# Patient Record
Sex: Female | Born: 1945 | Race: White | Hispanic: No | Marital: Married | Smoking: Former smoker
Health system: Southern US, Community
[De-identification: ages and names within clinical notes are randomized; demographics above are authoritative.]

## PROBLEM LIST (undated history)

## (undated) DIAGNOSIS — D496 Neoplasm of unspecified behavior of brain: Secondary | ICD-10-CM

## (undated) DIAGNOSIS — E559 Vitamin D deficiency, unspecified: Secondary | ICD-10-CM

## (undated) DIAGNOSIS — M1990 Unspecified osteoarthritis, unspecified site: Secondary | ICD-10-CM

## (undated) DIAGNOSIS — E7800 Pure hypercholesterolemia, unspecified: Secondary | ICD-10-CM

## (undated) DIAGNOSIS — I10 Essential (primary) hypertension: Secondary | ICD-10-CM

## (undated) DIAGNOSIS — R569 Unspecified convulsions: Secondary | ICD-10-CM

## (undated) DIAGNOSIS — I720 Aneurysm of carotid artery: Secondary | ICD-10-CM

## (undated) DIAGNOSIS — C801 Malignant (primary) neoplasm, unspecified: Secondary | ICD-10-CM

## (undated) DIAGNOSIS — M8580 Other specified disorders of bone density and structure, unspecified site: Secondary | ICD-10-CM

## (undated) DIAGNOSIS — D72819 Decreased white blood cell count, unspecified: Secondary | ICD-10-CM

## (undated) DIAGNOSIS — N179 Acute kidney failure, unspecified: Secondary | ICD-10-CM

## (undated) DIAGNOSIS — G43909 Migraine, unspecified, not intractable, without status migrainosus: Secondary | ICD-10-CM

## (undated) DIAGNOSIS — H543 Unqualified visual loss, both eyes: Secondary | ICD-10-CM

## (undated) DIAGNOSIS — I7090 Unspecified atherosclerosis: Secondary | ICD-10-CM

## (undated) DIAGNOSIS — M719 Bursopathy, unspecified: Secondary | ICD-10-CM

---

## 2006-09-06 DIAGNOSIS — C801 Malignant (primary) neoplasm, unspecified: Secondary | ICD-10-CM

## 2006-09-06 HISTORY — DX: Malignant (primary) neoplasm, unspecified: C80.1

## 2007-05-08 HISTORY — PX: BRAIN SURGERY: SHX531

## 2013-09-21 NOTE — Progress Notes (Signed)
Surgery scheduled for 10/16/13.  preop on 09/27/13 at 1430pm.  Need orders in EPIC.  Thank You.

## 2013-09-24 ENCOUNTER — Encounter (HOSPITAL_COMMUNITY): Payer: Self-pay | Admitting: Pharmacy Technician

## 2013-09-25 ENCOUNTER — Other Ambulatory Visit: Payer: Self-pay | Admitting: Orthopedic Surgery

## 2013-09-26 ENCOUNTER — Other Ambulatory Visit (HOSPITAL_COMMUNITY): Payer: Self-pay | Admitting: Orthopedic Surgery

## 2013-09-26 NOTE — Patient Instructions (Addendum)
[facility]  09/26/2013   Your procedure is scheduled on: Tuesday February 10th  Report to [facility] at 1030 AM.  Call this number if you have problems the morning of surgery [phone]   Remember: short stay will draw your type and screen morning of surgery   Do not eat food :After Midnight.   clear liquids midnight until 700 am day of surgery, then nothing by mouth.   Take these medicines the morning of surgery with A SIP OF WATER: keppra, wellbutrin                                SEE [facility] PREPARING FOR SURGERY SHEET             You may not have any metal on your body including hair pins and piercings  Do not wear jewelry, make-up.  Do not wear lotions, powders, or perfumes. You may wear deodorant.   Men may shave face and neck.  Do not bring valuables to the hospital. [place].  Contacts, dentures or bridgework may not be worn into surgery.  Leave suitcase in the car. After surgery it may be brought to your room.  For patients admitted to the hospital, checkout time is 11:00 AM the day of discharge.    Please read over the following fact sheets that you were given: [facility] Preparing for surgery sheet, mrsa information, blood sheet , clear liquid sheet,incnetive spirometer sheet   Call Zelphia Cairo RN pre op nurse if needed [phone]    [place].  PATIENT SIGNATURE___________________________________________  NURSE SIGNATURE_____________________________________________

## 2013-09-27 ENCOUNTER — Ambulatory Visit (HOSPITAL_COMMUNITY)
Admission: RE | Admit: 2013-09-27 | Discharge: 2013-09-27 | Disposition: A | Discharge status: Home / Self Care | Payer: BC Managed Care – PPO | Source: Ambulatory Visit | Attending: Orthopedic Surgery | Admitting: Orthopedic Surgery

## 2013-09-27 ENCOUNTER — Encounter (HOSPITAL_COMMUNITY): Payer: Self-pay

## 2013-09-27 ENCOUNTER — Encounter (HOSPITAL_COMMUNITY)
Admission: RE | Admit: 2013-09-27 | Discharge: 2013-09-27 | Disposition: A | Discharge status: Home / Self Care | Payer: BC Managed Care – PPO | Source: Ambulatory Visit | Attending: Orthopedic Surgery | Admitting: Orthopedic Surgery

## 2013-09-27 DIAGNOSIS — M899 Disorder of bone, unspecified: Secondary | ICD-10-CM | POA: Insufficient documentation

## 2013-09-27 DIAGNOSIS — M949 Disorder of cartilage, unspecified: Secondary | ICD-10-CM

## 2013-09-27 DIAGNOSIS — M169 Osteoarthritis of hip, unspecified: Secondary | ICD-10-CM | POA: Insufficient documentation

## 2013-09-27 DIAGNOSIS — Z01812 Encounter for preprocedural laboratory examination: Secondary | ICD-10-CM | POA: Diagnosis not present

## 2013-09-27 DIAGNOSIS — Z01810 Encounter for preprocedural cardiovascular examination: Secondary | ICD-10-CM | POA: Diagnosis not present

## 2013-09-27 DIAGNOSIS — M1610 Unilateral primary osteoarthritis, unspecified hip: Secondary | ICD-10-CM | POA: Insufficient documentation

## 2013-09-27 DIAGNOSIS — Z01818 Encounter for other preprocedural examination: Secondary | ICD-10-CM | POA: Insufficient documentation

## 2013-09-27 DIAGNOSIS — I10 Essential (primary) hypertension: Secondary | ICD-10-CM | POA: Insufficient documentation

## 2013-09-27 DIAGNOSIS — Z79899 Other long term (current) drug therapy: Secondary | ICD-10-CM | POA: Insufficient documentation

## 2013-09-27 HISTORY — DX: Bursopathy, unspecified: M71.9

## 2013-09-27 HISTORY — DX: Aneurysm of carotid artery: I72.0

## 2013-09-27 HISTORY — DX: Unqualified visual loss, both eyes: H54.3

## 2013-09-27 HISTORY — DX: Unspecified osteoarthritis, unspecified site: M19.90

## 2013-09-27 HISTORY — DX: Pure hypercholesterolemia, unspecified: E78.00

## 2013-09-27 HISTORY — DX: Essential (primary) hypertension: I10

## 2013-09-27 HISTORY — DX: Decreased white blood cell count, unspecified: D72.819

## 2013-09-27 HISTORY — DX: Unspecified convulsions: R56.9

## 2013-09-27 HISTORY — DX: Neoplasm of unspecified behavior of brain: D49.6

## 2013-09-27 HISTORY — DX: Other specified disorders of bone density and structure, unspecified site: M85.80

## 2013-09-27 HISTORY — DX: Malignant (primary) neoplasm, unspecified: C80.1

## 2013-09-27 HISTORY — DX: Acute kidney failure, unspecified: N17.9

## 2013-09-27 HISTORY — DX: Vitamin D deficiency, unspecified: E55.9

## 2013-09-27 HISTORY — DX: Migraine, unspecified, not intractable, without status migrainosus: G43.909

## 2013-09-27 HISTORY — DX: Unspecified atherosclerosis: I70.90

## 2013-09-27 LAB — URINE MICROSCOPIC-ADD ON

## 2013-09-27 LAB — SURGICAL PCR SCREEN
MRSA, PCR: NEGATIVE
Staphylococcus aureus: NEGATIVE

## 2013-09-27 LAB — COMPREHENSIVE METABOLIC PANEL
ALT: 24 U/L (ref 0–35)
AST: 25 U/L (ref 0–37)
Albumin: 3.7 g/dL (ref 3.5–5.2)
Alkaline Phosphatase: 73 U/L (ref 39–117)
BUN: 29 mg/dL — AB (ref 6–23)
CO2: 28 meq/L (ref 19–32)
CREATININE: 1.3 mg/dL — AB (ref 0.50–1.10)
Calcium: 9.1 mg/dL (ref 8.4–10.5)
Chloride: 103 mEq/L (ref 96–112)
GFR calc Af Amer: 49 mL/min — ABNORMAL LOW (ref >90)
GFR, EST NON AFRICAN AMERICAN: 42 mL/min — AB (ref >90)
Glucose, Bld: 95 mg/dL (ref 70–99)
Potassium: 3.9 mEq/L (ref 3.7–5.3)
Sodium: 142 mEq/L (ref 137–147)
Total Bilirubin: 0.4 mg/dL (ref 0.3–1.2)
Total Protein: 6.8 g/dL (ref 6.0–8.3)

## 2013-09-27 LAB — URINALYSIS, ROUTINE W REFLEX MICROSCOPIC
Bilirubin Urine: NEGATIVE
Glucose, UA: NEGATIVE mg/dL
Hgb urine dipstick: NEGATIVE
KETONES UR: NEGATIVE mg/dL
NITRITE: NEGATIVE
PH: 5.5 (ref 5.0–8.0)
PROTEIN: NEGATIVE mg/dL
Specific Gravity, Urine: 1.026 (ref 1.005–1.030)
UROBILINOGEN UA: 0.2 mg/dL (ref 0.0–1.0)

## 2013-09-27 LAB — PROTIME-INR
INR: 0.96 (ref 0.00–1.49)
PROTHROMBIN TIME: 12.6 s (ref 11.6–15.2)

## 2013-09-27 LAB — CBC
HEMATOCRIT: 37.9 % (ref 36.0–46.0)
Hemoglobin: 12.7 g/dL (ref 12.0–15.0)
MCH: 31.2 pg (ref 26.0–34.0)
MCHC: 33.5 g/dL (ref 30.0–36.0)
MCV: 93.1 fL (ref 78.0–100.0)
PLATELETS: 217 10*3/uL (ref 150–400)
RBC: 4.07 MIL/uL (ref 3.87–5.11)
RDW: 13 % (ref 11.5–15.5)
WBC: 4.2 10*3/uL (ref 4.0–10.5)

## 2013-09-27 LAB — APTT: aPTT: 27 seconds (ref 24–37)

## 2013-09-27 NOTE — Progress Notes (Addendum)
lov medical clearance note dr Dellis Filbert binney 09-12-2013 on chart Brain mri report 07-16-2013 on chart Dental clearance note on chart from [facility] walker rdh

## 2013-09-28 NOTE — Progress Notes (Signed)
Urine, micro results faxed 09-27-13 to dr Wynelle Link, fax received 09-28-13 and placed on pt chart, no action micro ua per drew perkins pa

## 2013-10-15 ENCOUNTER — Other Ambulatory Visit: Payer: Self-pay | Admitting: Orthopedic Surgery

## 2013-10-15 NOTE — H&P (Signed)
Tracy Skinner  DOB: 26-Sep-1945 Married / Language: English / Race: White Female   Date of Admission: 10-16-2013  Chief Complaint:  Right Hip Pain  History of Present Illness The patient is a 68 year old female who come for a preoperative History and Physical. The patient is scheduled for a right total hip arthroplasty (anterior approach) to be performed by Dr. Dione Plover. Aluisio, MD at [facility] on 10-16-2013. The patient was seen in referral.  The patient reports left hip and right hip (is worse) problems including pain and giving way symptoms that have been present for year(s). The symptoms began without any known injury. Symptoms reported include hip pain, weakness, pain with weightbearing and difficulty ambulating The patient reports symptoms radiating to the: right groin and left groin. The patient describes the hip problem as shooting and burning.The symptoms are described as moderate in severity.The patient feels as if their symptoms are feels as if they are improving (due to physical therpay). Symptoms are exacerbated by lying on the affected side. Symptoms are relieved by rest. Previous workup for this problem has included hip x-rays. Previous treatment for this problem has included physical therapy. She said both hips hurt terribly. The right hip is worse than the left. She has pain at all times. She has difficulty even ambulating. She was doing some physical therapy which has stretched her a little bit and made it a little easier to walk but she is still utilizing a walker to get around because of the pain, stiffness and weakness from the arthritis. She is ready to proceed with hip replacement surgery. They have been treated conservatively in the past for the above stated problem and despite conservative measures, they continue to have progressive pain and severe functional limitations and dysfunction. They have failed non-operative management including home exercise,  medications, and injections. It is felt that they would benefit from undergoing total joint replacement. Risks and benefits of the procedure have been discussed with the patient and they elect to proceed with surgery. There are no active contraindications to surgery such as ongoing infection or rapidly progressive neurological disease.   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 wears glasses Brain tumor. High Grade Oligoastrocytoma Osteoarthritis Hypercholesterolemia   Family History Hypertension. Brother, Father. Cancer. Father.   Social History Marital status. married Never consumed alcohol. 08/09/2013: Never consumed alcohol Exercise. Exercises rarely Living situation. live with spouse No history of drug/alcohol rehab Not under pain contract Tobacco use. Former smoker. 08/09/2013 Children. 0 Current work status. retired Regulatory affairs officer. Healthcare [employer]   Medication History Keppra (500MG  Tablet, Oral) Active. BuPROPion HCl ( Oral) Specific dose unknown - Active. Losartan Potassium (25MG  Tablet, Oral) Active. Simvastatin (10MG  Tablet, Oral) Active.   Past Surgical History Brain Surgery. Date: 05/2007. Craniotomy   Review of Systems General:Not Present- Chills, Fever, Night Sweats, Fatigue, Weight Gain, Weight Loss and Memory Loss. Skin:Not Present- Hives, Itching, Rash, Eczema and Lesions. HEENT:Not Present- Tinnitus, Headache, Double Vision, Visual Loss, Hearing Loss and Dentures. Respiratory:Not Present- Shortness of breath with exertion, Shortness of breath at rest, Allergies, Coughing up blood and Chronic  Cough. Cardiovascular:Not Present- Chest Pain, Racing/skipping heartbeats, Difficulty Breathing Lying Down, Murmur, Swelling and Palpitations. Gastrointestinal:Not Present- Bloody Stool, Heartburn, Abdominal Pain, Vomiting, Nausea, Constipation, Diarrhea, Difficulty Swallowing, Jaundice  and Loss of appetitie. Female Genitourinary:Not Present- Blood in Urine, Urinary frequency, Weak urinary stream, Discharge, Flank Pain, Incontinence, Painful Urination, Urgency, Urinary Retention and Urinating at Night. Musculoskeletal:Present- Joint Pain. Not Present- Muscle Weakness, Muscle Pain, Joint Swelling, Back Pain, Morning Stiffness and Spasms. Neurological:Not Present- Tremor, Dizziness, Blackout spells, Paralysis, Difficulty with balance and Weakness. Psychiatric:Not Present- Insomnia.    Vitals Weight: 160 lb Height: 63 in Weight was reported by patient. Height was reported by patient. Body Surface Area: 1.8 m Body Mass Index: 28.34 kg/m Pulse: 92 (Regular) Resp.: 16 (Unlabored) BP: 128/82 (Sitting, Left Arm, Standard)   Physical Exam The physical exam findings are as follows:   General Mental Status - Alert, cooperative and good historian. General Appearance- pleasant. Not in acute distress. Orientation- Oriented X3. Build & Nutrition- Well nourished and Well developed.   Head and Neck Head- normocephalic, atraumatic . Neck Global Assessment- supple. no bruit auscultated on the right and no bruit auscultated on the left.   Eye Vision- Wears corrective lenses. Pupil- Bilateral- Regular and Round. Motion- Bilateral- EOMI.   Chest and Lung Exam Auscultation: Breath sounds:- clear at anterior chest wall and - clear at posterior chest wall. Adventitious sounds:- No Adventitious sounds.   Cardiovascular Auscultation:Rhythm- Regular rate and rhythm. Heart Sounds- S1 WNL and S2 WNL. Murmurs & Other Heart Sounds:Auscultation of the  heart reveals - No Murmurs.   Abdomen Palpation/Percussion:Tenderness- Abdomen is non-tender to palpation. Rigidity (guarding)- Abdomen is soft. Auscultation:Auscultation of the abdomen reveals - Bowel sounds normal.   Female Genitourinary Not done, not pertinent to present illness  Musculoskeletal On exam she is alert and oriented in no apparent distress. Her right hip shows flexion to 80, no internal or external rotation, only about 10 degrees of abduction. Left hip flexion about 95, about 10 internal rotation, 10 external rotation, 20 abduction with pain. Knee exam is normal. Pulses, sensation and motor are intact both lower extremities.  RADIOGRAPHS: AP pelvis, lateral both hips show severe endstage arthritis both hips, right somewhat worse than the left with large subchondral cystic formation and large degenerative cysts in the right femoral head. She has some erosion of the femoral head on the right.   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 Wynelle Link.  Plan is to go to SNF - [facility] in [place], [place]  PCP - Dr. Merryl Hacker [facility]  The patient will not receive TXA (tranexamic acid) due to: Arterial Disease  Signed electronically by Joelene Millin, III PA-C

## 2013-10-16 ENCOUNTER — Encounter (HOSPITAL_COMMUNITY)
Admission: RE | Disposition: A | Discharge status: Skilled Nursing Facility | Payer: Self-pay | Source: Ambulatory Visit | Attending: Orthopedic Surgery

## 2013-10-16 ENCOUNTER — Encounter (HOSPITAL_COMMUNITY): Payer: BC Managed Care – PPO | Admitting: Anesthesiology

## 2013-10-16 ENCOUNTER — Inpatient Hospital Stay (HOSPITAL_COMMUNITY): Payer: BC Managed Care – PPO

## 2013-10-16 ENCOUNTER — Inpatient Hospital Stay (HOSPITAL_COMMUNITY)
Admission: RE | Admit: 2013-10-16 | Discharge: 2013-10-19 | DRG: 470 | Disposition: A | Discharge status: Skilled Nursing Facility | Payer: BC Managed Care – PPO | Source: Ambulatory Visit | Attending: Orthopedic Surgery | Admitting: Orthopedic Surgery

## 2013-10-16 ENCOUNTER — Inpatient Hospital Stay (HOSPITAL_COMMUNITY): Payer: BC Managed Care – PPO | Admitting: Anesthesiology

## 2013-10-16 ENCOUNTER — Encounter (HOSPITAL_COMMUNITY): Payer: Self-pay | Admitting: *Deleted

## 2013-10-16 DIAGNOSIS — M1610 Unilateral primary osteoarthritis, unspecified hip: Principal | ICD-10-CM | POA: Diagnosis present

## 2013-10-16 DIAGNOSIS — I10 Essential (primary) hypertension: Secondary | ICD-10-CM | POA: Diagnosis present

## 2013-10-16 DIAGNOSIS — Z6828 Body mass index (BMI) 28.0-28.9, adult: Secondary | ICD-10-CM

## 2013-10-16 DIAGNOSIS — R5383 Other fatigue: Secondary | ICD-10-CM

## 2013-10-16 DIAGNOSIS — M899 Disorder of bone, unspecified: Secondary | ICD-10-CM | POA: Diagnosis present

## 2013-10-16 DIAGNOSIS — Z96649 Presence of unspecified artificial hip joint: Secondary | ICD-10-CM

## 2013-10-16 DIAGNOSIS — K219 Gastro-esophageal reflux disease without esophagitis: Secondary | ICD-10-CM | POA: Diagnosis present

## 2013-10-16 DIAGNOSIS — M949 Disorder of cartilage, unspecified: Secondary | ICD-10-CM

## 2013-10-16 DIAGNOSIS — R5381 Other malaise: Secondary | ICD-10-CM | POA: Diagnosis present

## 2013-10-16 DIAGNOSIS — M169 Osteoarthritis of hip, unspecified: Secondary | ICD-10-CM | POA: Diagnosis present

## 2013-10-16 DIAGNOSIS — Z87891 Personal history of nicotine dependence: Secondary | ICD-10-CM

## 2013-10-16 DIAGNOSIS — D62 Acute posthemorrhagic anemia: Secondary | ICD-10-CM | POA: Diagnosis not present

## 2013-10-16 DIAGNOSIS — E7800 Pure hypercholesterolemia, unspecified: Secondary | ICD-10-CM | POA: Diagnosis present

## 2013-10-16 DIAGNOSIS — I7090 Unspecified atherosclerosis: Secondary | ICD-10-CM | POA: Diagnosis present

## 2013-10-16 HISTORY — PX: TOTAL HIP ARTHROPLASTY: SHX124

## 2013-10-16 LAB — TYPE AND SCREEN
ABO/RH(D): A POS
ANTIBODY SCREEN: NEGATIVE

## 2013-10-16 LAB — ABO/RH: ABO/RH(D): A POS

## 2013-10-16 SURGERY — ARTHROPLASTY, HIP, TOTAL, ANTERIOR APPROACH
Anesthesia: General | Site: Hip | Laterality: Right

## 2013-10-16 MED ORDER — ONDANSETRON HCL 4 MG/2ML IJ SOLN
INTRAMUSCULAR | Status: AC
  Filled 2013-10-16: qty 2

## 2013-10-16 MED ORDER — NEOSTIGMINE METHYLSULFATE 1 MG/ML IJ SOLN
INTRAMUSCULAR | Status: DC | PRN
  Administered 2013-10-16: 4 mg via INTRAVENOUS

## 2013-10-16 MED ORDER — LEVETIRACETAM ER 500 MG PO TB24
500.0000 mg | ORAL_TABLET | Freq: Three times a day (TID) | ORAL | Status: DC
  Administered 2013-10-16 – 2013-10-19 (×8): 500 mg via ORAL

## 2013-10-16 MED ORDER — ACETAMINOPHEN 500 MG PO TABS
1000.0000 mg | ORAL_TABLET | Freq: Four times a day (QID) | ORAL | Status: AC
  Administered 2013-10-16: 1000 mg via ORAL
  Filled 2013-10-16 (×3): qty 2

## 2013-10-16 MED ORDER — DOCUSATE SODIUM 100 MG PO CAPS
100.0000 mg | ORAL_CAPSULE | Freq: Two times a day (BID) | ORAL | Status: DC
  Administered 2013-10-16 – 2013-10-19 (×6): 100 mg via ORAL

## 2013-10-16 MED ORDER — SODIUM CHLORIDE 0.9 % IJ SOLN
INTRAMUSCULAR | Status: AC
  Filled 2013-10-16: qty 50

## 2013-10-16 MED ORDER — BUPROPION HCL ER (XL) 300 MG PO TB24
450.0000 mg | ORAL_TABLET | Freq: Every morning | ORAL | Status: DC
  Administered 2013-10-17 – 2013-10-19 (×3): 450 mg via ORAL
  Filled 2013-10-16 (×3): qty 1

## 2013-10-16 MED ORDER — FLEET ENEMA 7-19 GM/118ML RE ENEM: 1.0000 | ENEMA | Freq: Once | RECTAL | Status: AC | PRN

## 2013-10-16 MED ORDER — OXYCODONE HCL 5 MG PO TABS
5.0000 mg | ORAL_TABLET | ORAL | Status: DC | PRN
  Administered 2013-10-16 – 2013-10-19 (×3): 5 mg via ORAL
  Filled 2013-10-16 (×5): qty 1

## 2013-10-16 MED ORDER — KETOROLAC TROMETHAMINE 15 MG/ML IJ SOLN
7.5000 mg | Freq: Four times a day (QID) | INTRAMUSCULAR | Status: AC | PRN
  Administered 2013-10-16: 7.5 mg via INTRAVENOUS

## 2013-10-16 MED ORDER — 0.9 % SODIUM CHLORIDE (POUR BTL) OPTIME
TOPICAL | Status: DC | PRN
  Administered 2013-10-16: 1000 mL

## 2013-10-16 MED ORDER — CHLORHEXIDINE GLUCONATE CLOTH 2 % EX PADS: 6.0000 | MEDICATED_PAD | Freq: Once | CUTANEOUS | Status: DC

## 2013-10-16 MED ORDER — BISACODYL 10 MG RE SUPP: 10.0000 mg | Freq: Every day | RECTAL | Status: DC | PRN

## 2013-10-16 MED ORDER — BUPIVACAINE LIPOSOME 1.3 % IJ SUSP
20.0000 mL | Freq: Once | INTRAMUSCULAR | Status: DC
  Filled 2013-10-16: qty 20

## 2013-10-16 MED ORDER — GLYCOPYRROLATE 0.2 MG/ML IJ SOLN
INTRAMUSCULAR | Status: DC | PRN
  Administered 2013-10-16: .6 mg via INTRAVENOUS

## 2013-10-16 MED ORDER — ACETAMINOPHEN 500 MG PO TABS
1000.0000 mg | ORAL_TABLET | Freq: Once | ORAL | Status: AC
  Administered 2013-10-16: 1000 mg via ORAL
  Filled 2013-10-16: qty 2

## 2013-10-16 MED ORDER — MIDAZOLAM HCL 2 MG/2ML IJ SOLN
INTRAMUSCULAR | Status: AC
  Filled 2013-10-16: qty 2

## 2013-10-16 MED ORDER — DEXAMETHASONE SODIUM PHOSPHATE 10 MG/ML IJ SOLN
INTRAMUSCULAR | Status: AC
  Filled 2013-10-16: qty 1

## 2013-10-16 MED ORDER — DEXAMETHASONE SODIUM PHOSPHATE 10 MG/ML IJ SOLN
10.0000 mg | Freq: Every day | INTRAMUSCULAR | Status: AC
  Filled 2013-10-16: qty 1

## 2013-10-16 MED ORDER — HYDROMORPHONE HCL PF 1 MG/ML IJ SOLN
INTRAMUSCULAR | Status: DC | PRN
  Administered 2013-10-16 (×3): 0.5 mg via INTRAVENOUS

## 2013-10-16 MED ORDER — LIDOCAINE HCL (CARDIAC) 20 MG/ML IV SOLN
INTRAVENOUS | Status: DC | PRN
  Administered 2013-10-16: 80 mg via INTRAVENOUS

## 2013-10-16 MED ORDER — HYDROMORPHONE HCL PF 2 MG/ML IJ SOLN
INTRAMUSCULAR | Status: AC
  Filled 2013-10-16: qty 1

## 2013-10-16 MED ORDER — ONDANSETRON HCL 4 MG/2ML IJ SOLN
INTRAMUSCULAR | Status: DC | PRN
  Administered 2013-10-16: 4 mg via INTRAVENOUS

## 2013-10-16 MED ORDER — PROPOFOL 10 MG/ML IV BOLUS
INTRAVENOUS | Status: AC
  Filled 2013-10-16: qty 20

## 2013-10-16 MED ORDER — ONDANSETRON HCL 4 MG PO TABS: 4.0000 mg | ORAL_TABLET | Freq: Four times a day (QID) | ORAL | Status: DC | PRN

## 2013-10-16 MED ORDER — CEFAZOLIN SODIUM-DEXTROSE 2-3 GM-% IV SOLR
INTRAVENOUS | Status: AC
  Filled 2013-10-16: qty 50

## 2013-10-16 MED ORDER — PHENOL 1.4 % MT LIQD: 1.0000 | OROMUCOSAL | Status: DC | PRN

## 2013-10-16 MED ORDER — PROPOFOL 10 MG/ML IV BOLUS
INTRAVENOUS | Status: DC | PRN
  Administered 2013-10-16: 150 mg via INTRAVENOUS

## 2013-10-16 MED ORDER — SIMVASTATIN 10 MG PO TABS
10.0000 mg | ORAL_TABLET | Freq: Every evening | ORAL | Status: DC
  Administered 2013-10-16 – 2013-10-18 (×3): 10 mg via ORAL
  Filled 2013-10-16 (×4): qty 1

## 2013-10-16 MED ORDER — SODIUM CHLORIDE 0.9 % IV SOLN: INTRAVENOUS | Status: DC

## 2013-10-16 MED ORDER — DIPHENHYDRAMINE HCL 12.5 MG/5ML PO ELIX: 12.5000 mg | ORAL_SOLUTION | ORAL | Status: DC | PRN

## 2013-10-16 MED ORDER — CEFAZOLIN SODIUM-DEXTROSE 2-3 GM-% IV SOLR
2.0000 g | Freq: Four times a day (QID) | INTRAVENOUS | Status: AC
  Administered 2013-10-16 – 2013-10-17 (×2): 2 g via INTRAVENOUS
  Filled 2013-10-16 (×2): qty 50

## 2013-10-16 MED ORDER — LIDOCAINE HCL (CARDIAC) 20 MG/ML IV SOLN
INTRAVENOUS | Status: AC
  Filled 2013-10-16: qty 5

## 2013-10-16 MED ORDER — KETOROLAC TROMETHAMINE 15 MG/ML IJ SOLN
INTRAMUSCULAR | Status: AC
  Filled 2013-10-16: qty 1

## 2013-10-16 MED ORDER — FENTANYL CITRATE 0.05 MG/ML IJ SOLN
INTRAMUSCULAR | Status: DC | PRN
  Administered 2013-10-16 (×2): 50 ug via INTRAVENOUS
  Administered 2013-10-16: 100 ug via INTRAVENOUS
  Administered 2013-10-16: 50 ug via INTRAVENOUS

## 2013-10-16 MED ORDER — MENTHOL 3 MG MT LOZG
1.0000 | LOZENGE | OROMUCOSAL | Status: DC | PRN
  Filled 2013-10-16: qty 9

## 2013-10-16 MED ORDER — ONDANSETRON HCL 4 MG/2ML IJ SOLN
4.0000 mg | Freq: Four times a day (QID) | INTRAMUSCULAR | Status: DC | PRN
Start: 2013-10-16 — End: 2013-10-19

## 2013-10-16 MED ORDER — LOSARTAN POTASSIUM 50 MG PO TABS
50.0000 mg | ORAL_TABLET | Freq: Every day | ORAL | Status: DC
  Administered 2013-10-16 – 2013-10-18 (×3): 50 mg via ORAL
  Filled 2013-10-16 (×4): qty 1

## 2013-10-16 MED ORDER — RIVAROXABAN 10 MG PO TABS
10.0000 mg | ORAL_TABLET | Freq: Every day | ORAL | Status: DC
  Administered 2013-10-17 – 2013-10-19 (×3): 10 mg via ORAL
  Filled 2013-10-16 (×4): qty 1

## 2013-10-16 MED ORDER — METOCLOPRAMIDE HCL 5 MG/ML IJ SOLN
5.0000 mg | Freq: Three times a day (TID) | INTRAMUSCULAR | Status: DC | PRN
  Administered 2013-10-16: 5 mg via INTRAVENOUS

## 2013-10-16 MED ORDER — LACTATED RINGERS IV SOLN: INTRAVENOUS | Status: DC

## 2013-10-16 MED ORDER — METOCLOPRAMIDE HCL 10 MG PO TABS: 5.0000 mg | ORAL_TABLET | Freq: Three times a day (TID) | ORAL | Status: DC | PRN

## 2013-10-16 MED ORDER — KCL IN DEXTROSE-NACL 20-5-0.45 MEQ/L-%-% IV SOLN
INTRAVENOUS | Status: DC
  Administered 2013-10-16 – 2013-10-17 (×2): 75 mL/h via INTRAVENOUS
  Filled 2013-10-16 (×3): qty 1000

## 2013-10-16 MED ORDER — METOCLOPRAMIDE HCL 5 MG/ML IJ SOLN
INTRAMUSCULAR | Status: AC
  Filled 2013-10-16: qty 2

## 2013-10-16 MED ORDER — FENTANYL CITRATE 0.05 MG/ML IJ SOLN
INTRAMUSCULAR | Status: AC
  Filled 2013-10-16: qty 5

## 2013-10-16 MED ORDER — GLYCOPYRROLATE 0.2 MG/ML IJ SOLN
INTRAMUSCULAR | Status: AC
  Filled 2013-10-16: qty 3

## 2013-10-16 MED ORDER — ACETAMINOPHEN 650 MG RE SUPP: 650.0000 mg | Freq: Four times a day (QID) | RECTAL | Status: DC | PRN

## 2013-10-16 MED ORDER — DEXAMETHASONE SODIUM PHOSPHATE 10 MG/ML IJ SOLN
10.0000 mg | Freq: Once | INTRAMUSCULAR | Status: AC
  Administered 2013-10-16: 10 mg via INTRAVENOUS

## 2013-10-16 MED ORDER — TRAMADOL HCL 50 MG PO TABS: 50.0000 mg | ORAL_TABLET | Freq: Four times a day (QID) | ORAL | Status: DC | PRN

## 2013-10-16 MED ORDER — SODIUM CHLORIDE 0.9 % IJ SOLN
INTRAMUSCULAR | Status: DC | PRN
  Administered 2013-10-16: 15:00:00

## 2013-10-16 MED ORDER — BUPIVACAINE HCL (PF) 0.25 % IJ SOLN
INTRAMUSCULAR | Status: DC | PRN
  Administered 2013-10-16: 30 mL

## 2013-10-16 MED ORDER — POLYETHYLENE GLYCOL 3350 17 G PO PACK: 17.0000 g | PACK | Freq: Every day | ORAL | Status: DC | PRN

## 2013-10-16 MED ORDER — CEFAZOLIN SODIUM-DEXTROSE 2-3 GM-% IV SOLR
2.0000 g | INTRAVENOUS | Status: AC
  Administered 2013-10-16: 2 g via INTRAVENOUS

## 2013-10-16 MED ORDER — HYDROMORPHONE HCL PF 1 MG/ML IJ SOLN: 0.2500 mg | INTRAMUSCULAR | Status: DC | PRN

## 2013-10-16 MED ORDER — SUCCINYLCHOLINE CHLORIDE 20 MG/ML IJ SOLN
INTRAMUSCULAR | Status: DC | PRN
  Administered 2013-10-16: 100 mg via INTRAVENOUS

## 2013-10-16 MED ORDER — METHOCARBAMOL 100 MG/ML IJ SOLN
500.0000 mg | Freq: Four times a day (QID) | INTRAVENOUS | Status: DC | PRN
  Administered 2013-10-16: 500 mg via INTRAVENOUS
  Filled 2013-10-16: qty 5

## 2013-10-16 MED ORDER — DEXAMETHASONE 4 MG PO TABS
10.0000 mg | ORAL_TABLET | Freq: Every day | ORAL | Status: AC
  Administered 2013-10-17: 10 mg via ORAL
  Filled 2013-10-16: qty 1

## 2013-10-16 MED ORDER — ACETAMINOPHEN 325 MG PO TABS
650.0000 mg | ORAL_TABLET | Freq: Four times a day (QID) | ORAL | Status: DC | PRN
  Administered 2013-10-18 – 2013-10-19 (×4): 650 mg via ORAL
  Filled 2013-10-16 (×4): qty 2

## 2013-10-16 MED ORDER — METHOCARBAMOL 500 MG PO TABS
500.0000 mg | ORAL_TABLET | Freq: Four times a day (QID) | ORAL | Status: DC | PRN
  Administered 2013-10-17 – 2013-10-19 (×2): 500 mg via ORAL
  Filled 2013-10-16 (×2): qty 1

## 2013-10-16 MED ORDER — LACTATED RINGERS IV SOLN
INTRAVENOUS | Status: DC | PRN
  Administered 2013-10-16: 12:00:00 via INTRAVENOUS

## 2013-10-16 MED ORDER — BUPIVACAINE HCL (PF) 0.25 % IJ SOLN
INTRAMUSCULAR | Status: AC
  Filled 2013-10-16: qty 30

## 2013-10-16 MED ORDER — ROCURONIUM BROMIDE 100 MG/10ML IV SOLN
INTRAVENOUS | Status: DC | PRN
  Administered 2013-10-16: 30 mg via INTRAVENOUS

## 2013-10-16 MED ORDER — MIDAZOLAM HCL 5 MG/5ML IJ SOLN
INTRAMUSCULAR | Status: DC | PRN
  Administered 2013-10-16: 2 mg via INTRAVENOUS

## 2013-10-16 SURGICAL SUPPLY — 41 items
BAG ZIPLOCK 12X15 (MISCELLANEOUS) IMPLANT
BLADE SAW SGTL 18X1.27X75 (BLADE) ×2 IMPLANT
BLADE SAW SGTL 18X1.27X75MM (BLADE) ×1
CAPT HIP PF COP ×3 IMPLANT
CLOSURE WOUND 1/2 X4 (GAUZE/BANDAGES/DRESSINGS) ×1
DECANTER SPIKE VIAL GLASS SM (MISCELLANEOUS) ×3 IMPLANT
DRAPE C-ARM 42X120 X-RAY (DRAPES) ×3 IMPLANT
DRAPE STERI IOBAN 125X83 (DRAPES) ×3 IMPLANT
DRAPE U-SHAPE 47X51 STRL (DRAPES) ×9 IMPLANT
DRSG ADAPTIC 3X8 NADH LF (GAUZE/BANDAGES/DRESSINGS) ×3 IMPLANT
DRSG MEPILEX BORDER 4X4 (GAUZE/BANDAGES/DRESSINGS) ×3 IMPLANT
DRSG MEPILEX BORDER 4X8 (GAUZE/BANDAGES/DRESSINGS) ×3 IMPLANT
DURAPREP 26ML APPLICATOR (WOUND CARE) ×3 IMPLANT
ELECT BLADE 6.5 EXT (BLADE) ×3 IMPLANT
ELECT REM PT RETURN 9FT ADLT (ELECTROSURGICAL) ×3
ELECTRODE REM PT RTRN 9FT ADLT (ELECTROSURGICAL) ×1 IMPLANT
EVACUATOR 1/8 PVC DRAIN (DRAIN) ×3 IMPLANT
FACESHIELD LNG OPTICON STERILE (SAFETY) ×12 IMPLANT
GLOVE BIO SURGEON STRL SZ7.5 (GLOVE) ×3 IMPLANT
GLOVE BIO SURGEON STRL SZ8 (GLOVE) ×6 IMPLANT
GLOVE BIOGEL PI IND STRL 8 (GLOVE) ×2 IMPLANT
GLOVE BIOGEL PI INDICATOR 8 (GLOVE) ×4
GOWN STRL REUS W/TWL LRG LVL3 (GOWN DISPOSABLE) ×3 IMPLANT
GOWN STRL REUS W/TWL XL LVL3 (GOWN DISPOSABLE) ×3 IMPLANT
KIT BASIN OR (CUSTOM PROCEDURE TRAY) ×3 IMPLANT
NDL SAFETY ECLIPSE 18X1.5 (NEEDLE) ×2 IMPLANT
NEEDLE HYPO 18GX1.5 SHARP (NEEDLE) ×4
PACK TOTAL JOINT (CUSTOM PROCEDURE TRAY) ×3 IMPLANT
PADDING CAST COTTON 6X4 STRL (CAST SUPPLIES) ×3 IMPLANT
SPONGE GAUZE 4X4 12PLY (GAUZE/BANDAGES/DRESSINGS) IMPLANT
STRIP CLOSURE SKIN 1/2X4 (GAUZE/BANDAGES/DRESSINGS) ×2 IMPLANT
SUCTION FRAZIER 12FR DISP (SUCTIONS) IMPLANT
SUT ETHIBOND NAB CT1 #1 30IN (SUTURE) ×3 IMPLANT
SUT MNCRL AB 4-0 PS2 18 (SUTURE) ×3 IMPLANT
SUT VIC AB 2-0 CT1 27 (SUTURE) ×4
SUT VIC AB 2-0 CT1 TAPERPNT 27 (SUTURE) ×2 IMPLANT
SUT VLOC 180 0 24IN GS25 (SUTURE) ×3 IMPLANT
SYR 20CC LL (SYRINGE) ×3 IMPLANT
SYR 50ML LL SCALE MARK (SYRINGE) ×3 IMPLANT
TOWEL OR 17X26 10 PK STRL BLUE (TOWEL DISPOSABLE) ×3 IMPLANT
TRAY FOLEY CATH 14FRSI W/METER (CATHETERS) ×3 IMPLANT

## 2013-10-16 NOTE — Progress Notes (Signed)
Utilization review completed.  

## 2013-10-16 NOTE — Op Note (Signed)
OPERATIVE REPORT  PREOPERATIVE DIAGNOSIS: Osteoarthritis of the Right hip.   POSTOPERATIVE DIAGNOSIS: Osteoarthritis of the Right  hip.   PROCEDURE: Right total hip arthroplasty, anterior approach.   SURGEON: Gaynelle Arabian, MD   ASSISTANT: Arlee Muslim, PA-C  ANESTHESIA:  General  ESTIMATED BLOOD LOSS:- 500 ml  DRAINS: Hemovac x1.   COMPLICATIONS: None   CONDITION: PACU - hemodynamically stable.   BRIEF CLINICAL NOTE: Tracy Skinner is a 68 y.o. female who has advanced end-  stage arthritis of his Right  hip with progressively worsening pain and  dysfunction.The patient has failed nonoperative management and presents for  total hip arthroplasty.   PROCEDURE IN DETAIL: After successful administration of spinal  anesthetic, the traction boots for the [facility] bed were placed on both  feet and the patient was placed onto the [facility] bed, boots placed into the leg  holders. The Right hip was then isolated from the perineum with plastic  drapes and prepped and draped in the usual sterile fashion. ASIS and  greater trochanter were marked and a oblique incision was made, starting  at about 1 cm lateral and 2 cm distal to the ASIS and coursing towards  the anterior cortex of the femur. The skin was cut with a 10 blade  through subcutaneous tissue to the level of the fascia overlying the  tensor fascia lata muscle. The fascia was then incised in line with the  incision at the junction of the anterior third and posterior 2/3rd. The  muscle was teased off the fascia and then the interval between the TFL  and the rectus was developed. The Hohmann retractor was then placed at  the top of the femoral neck over the capsule. The vessels overlying the  capsule were cauterized and the fat on top of the capsule was removed.  A Hohmann retractor was then placed anterior underneath the rectus  femoris to give exposure to the entire anterior capsule. A T-shaped  capsulotomy was performed. The  edges were tagged and the femoral head  was identified.       Osteophytes are removed off the superior acetabulum.  The femoral neck was then cut in situ with an oscillating saw. Traction  was then applied to the left lower extremity utilizing the [facility]  traction. The femoral head was then removed. Retractors were placed  around the acetabulum and then circumferential removal of the labrum was  performed. Osteophytes were also removed. Reaming starts at 45 mm to  medialize and  Increased in 2 mm increments to 47 mm. We reamed in  approximately 40 degrees of abduction, 20 degrees anteversion. A 48 mm  pinnacle acetabular shell was then impacted in anatomic position under  fluoroscopic guidance with excellent purchase. We did not need to place  any additional dome screws. A 28 mm neutral + 4 marathon liner was then  placed into the acetabular shell.       The femoral lift was then placed along the lateral aspect of the femur  just distal to the vastus ridge. The leg was  externally rotated and capsule  was stripped off the inferior aspect of the femoral neck down to the  level of the lesser trochanter, this was done with electrocautery. The femur was lifted after this was performed. The  leg was then placed and extended in adducted position to essentially delivering the femur. We also removed the capsule superiorly and the  piriformis from the piriformis fossa  to gain excellent exposure of the  proximal femur. Rongeur was used to remove some cancellous bone to get  into the lateral portion of the proximal femur for placement of the  initial starter reamer. The starter broaches was placed  the starter broach  and was shown to go down the center of the canal. Broaching  with the  [place] system was then performed starting at size 8, coursing  Up to size 11. A size 11 had excellent torsional and rotational  and axial stability. The trial high offset neck was then placed  with a 28 + 1.5 trial  head. The hip was then reduced. We confirmed that  the stem was in the canal both on AP and lateral x-rays. It also has excellent sizing. The hip was reduced with outstanding stability through full extension, full external rotation,  and then flexion in adduction internal rotation. AP pelvis was taken  and the leg lengths were measured and found to be exactly equal. Hip  was then dislocated again and the femoral head and neck removed. The  femoral broach was removed. Size 11 [place] stem with a high offset  neck was then impacted into the femur following native anteversion. Has  excellent purchase in the canal. Excellent torsional and rotational and  axial stability. It is confirmed to be in the canal on AP and lateral  fluoroscopic views. The 28 + 1.5 ceramic head was placed and the hip  reduced with outstanding stability. Again AP pelvis was taken and it  confirmed that the leg lengths were equal. The wound was then copiously  irrigated with saline solution and the capsule reattached and repaired  with Ethibond suture.  20 mL of Exparel mixed with 50 mL of saline then additional 20 ml of .25% Bupivicaine injected into the capsule and into the edge of the tensor fascia lata as well as subcutaneous tissue. The fascia overlying the tensor fascia lata was  then closed with a running #1 V-Loc. Subcu was closed with interrupted  2-0 Vicryl and subcuticular running 4-0 Monocryl. Incision was cleaned  and dried. Steri-Strips and a bulky sterile dressing applied. Hemovac  drain was hooked to suction and then he was awakened and transported to  recovery in stable condition.        Please note that a surgical assistant was a medical necessity for this procedure to perform it in a safe and expeditious manner. Assistant was necessary to provide appropriate retraction of vital neurovascular structures and to prevent femoral fracture and allow for anatomic placement of the prosthesis.  Gaynelle Arabian, M.D.

## 2013-10-16 NOTE — Anesthesia Postprocedure Evaluation (Signed)
  Anesthesia Post-op Note  Patient: Tracy Skinner  Procedure(s) Performed: Procedure(s) (LRB): RIGHT TOTAL HIP ARTHROPLASTY ANTERIOR APPROACH (Right)  Patient Location: PACU  Anesthesia Type: General  Level of Consciousness: awake and alert   Airway and Oxygen Therapy: Patient Spontanous Breathing  Post-op Pain: mild  Post-op Assessment: Post-op Vital signs reviewed, Patient's Cardiovascular Status Stable, Respiratory Function Stable, Patent Airway and No signs of Nausea or vomiting  Last Vitals:  Filed Vitals:   10/16/13 1600  BP: 118/81  Pulse: 74  Temp:   Resp: 11    Post-op Vital Signs: stable   Complications: No apparent anesthesia complications

## 2013-10-16 NOTE — Transfer of Care (Signed)
Immediate Anesthesia Transfer of Care Note  Patient: Tracy Skinner  Procedure(s) Performed: Procedure(s): RIGHT TOTAL HIP ARTHROPLASTY ANTERIOR APPROACH (Right)  Patient Location: PACU  Anesthesia Type:General  Level of Consciousness: awake, alert , oriented and patient cooperative  Airway & Oxygen Therapy: Patient Spontanous Breathing and Patient connected to face mask oxygen  Post-op Assessment: Report given to PACU RN, Post -op Vital signs reviewed and stable and Patient moving all extremities  Post vital signs: Reviewed and stable  Complications: No apparent anesthesia complications

## 2013-10-16 NOTE — Anesthesia Preprocedure Evaluation (Addendum)
Anesthesia Evaluation  Patient identified by MRN, date of birth, ID band Patient awake    Reviewed: Allergy & Precautions, H&P , NPO status , Patient's Chart, lab work & pertinent test results  Airway Mallampati: II TM Distance: >3 FB Neck ROM: full    Dental no notable dental hx. (+) Teeth Intact and Dental Advisory Given   Pulmonary neg pulmonary ROS, former smoker,  breath sounds clear to auscultation  Pulmonary exam normal       Cardiovascular Exercise Tolerance: Good hypertension, Pt. on medications Rhythm:regular Rate:Normal     Neuro/Psych  Headaches, Seizures -, Well Controlled,  High grade oligogastrocytoma. Last seizure 4 years ago. Carotid artery aneurysm negative psych ROS   GI/Hepatic negative GI ROS, Neg liver ROS,   Endo/Other  negative endocrine ROS  Renal/GU negative Renal ROSHistory ARF due to dehydration  negative genitourinary   Musculoskeletal   Abdominal   Peds  Hematology negative hematology ROS (+)   Anesthesia Other Findings   Reproductive/Obstetrics negative OB ROS                        Anesthesia Physical Anesthesia Plan  ASA: III  Anesthesia Plan: General   Post-op Pain Management:    Induction: Intravenous  Airway Management Planned: Oral ETT  Additional Equipment:   Intra-op Plan:   Post-operative Plan: Extubation in OR  Informed Consent: I have reviewed the patients History and Physical, chart, labs and discussed the procedure including the risks, benefits and alternatives for the proposed anesthesia with the patient or authorized representative who has indicated his/her understanding and acceptance.   Dental Advisory Given  Plan Discussed with: CRNA and Surgeon  Anesthesia Plan Comments:        Anesthesia Quick Evaluation

## 2013-10-16 NOTE — Preoperative (Signed)
Beta Blockers   Reason not to administer Beta Blockers:Not Applicable 

## 2013-10-16 NOTE — Interval H&P Note (Signed)
History and Physical Interval Note:  10/16/2013 1:22 PM  Tracy Skinner  has presented today for surgery, with the diagnosis of osteoarthritis of the Right Hip  The various methods of treatment have been discussed with the patient and family. After consideration of risks, benefits and other options for treatment, the patient has consented to  Procedure(s): RIGHT TOTAL HIP ARTHROPLASTY ANTERIOR APPROACH (Right) as a surgical intervention .  The patient's history has been reviewed, patient examined, no change in status, stable for surgery.  I have reviewed the patient's chart and labs.  Questions were answered to the patient's satisfaction.     [name]

## 2013-10-16 NOTE — H&P (View-Only) (Signed)
Tracy Skinner  DOB: 03/07/1946 Married / Language: English / Race: White Female   Date of Admission: 10-16-2013  Chief Complaint:  Right Hip Pain  History of Present Illness The patient is a 68 year old female who come for a preoperative History and Physical. The patient is scheduled for a right total hip arthroplasty (anterior approach) to be performed by Dr. Frank V. Aluisio, MD at [facility] on 10-16-2013. The patient was seen in referral.  The patient reports left hip and right hip (is worse) problems including pain and giving way symptoms that have been present for year(s). The symptoms began without any known injury. Symptoms reported include hip pain, weakness, pain with weightbearing and difficulty ambulating The patient reports symptoms radiating to the: right groin and left groin. The patient describes the hip problem as shooting and burning.The symptoms are described as moderate in severity.The patient feels as if their symptoms are feels as if they are improving (due to physical therpay). Symptoms are exacerbated by lying on the affected side. Symptoms are relieved by rest. Previous workup for this problem has included hip x-rays. Previous treatment for this problem has included physical therapy. She said both hips hurt terribly. The right hip is worse than the left. She has pain at all times. She has difficulty even ambulating. She was doing some physical therapy which has stretched her a little bit and made it a little easier to walk but she is still utilizing a walker to get around because of the pain, stiffness and weakness from the arthritis. She is ready to proceed with hip replacement surgery. They have been treated conservatively in the past for the above stated problem and despite conservative measures, they continue to have progressive pain and severe functional limitations and dysfunction. They have failed non-operative management including home exercise,  medications, and injections. It is felt that they would benefit from undergoing total joint replacement. Risks and benefits of the procedure have been discussed with the patient and they elect to proceed with surgery. There are no active contraindications to surgery such as ongoing infection or rapidly progressive neurological disease.   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 wears glasses Brain tumor. High Grade Oligoastrocytoma Osteoarthritis Hypercholesterolemia   Family History Hypertension. Brother, Father. Cancer. Father.   Social History Marital status. married Never consumed alcohol. 08/09/2013: Never consumed alcohol Exercise. Exercises rarely Living situation. live with spouse No history of drug/alcohol rehab Not under pain contract Tobacco use. Former smoker. 08/09/2013 Children. 0 Current work status. retired [employer] Directives. Healthcare POA   Medication History Keppra (500MG Tablet, Oral) Active. BuPROPion HCl ( Oral) Specific dose unknown - Active. Losartan Potassium (25MG Tablet, Oral) Active. Simvastatin (10MG Tablet, Oral) Active.   Past Surgical History Brain Surgery. Date: 05/2007. Craniotomy   Review of Systems General:Not Present- Chills, Fever, Night Sweats, Fatigue, Weight Gain, Weight Loss and Memory Loss. Skin:Not Present- Hives, Itching, Rash, Eczema and Lesions. HEENT:Not Present- Tinnitus, Headache, Double Vision, Visual Loss, Hearing Loss and Dentures. Respiratory:Not Present- Shortness of breath with exertion, Shortness of breath at rest, Allergies, Coughing up blood and Chronic  Cough. Cardiovascular:Not Present- Chest Pain, Racing/skipping heartbeats, Difficulty Breathing Lying Down, Murmur, Swelling and Palpitations. Gastrointestinal:Not Present- Bloody Stool, Heartburn, Abdominal Pain, Vomiting, Nausea, Constipation, Diarrhea, Difficulty Swallowing, Jaundice   and Loss of appetitie. Female Genitourinary:Not Present- Blood in Urine, Urinary frequency, Weak urinary stream, Discharge, Flank Pain, Incontinence, Painful Urination, Urgency, Urinary Retention and Urinating at Night. Musculoskeletal:Present- Joint Pain. Not Present- Muscle Weakness, Muscle Pain, Joint Swelling, Back Pain, Morning Stiffness and Spasms. Neurological:Not Present- Tremor, Dizziness, Blackout spells, Paralysis, Difficulty with balance and Weakness. Psychiatric:Not Present- Insomnia.    Vitals Weight: 160 lb Height: 63 in Weight was reported by patient. Height was reported by patient. Body Surface Area: 1.8 m Body Mass Index: 28.34 kg/m Pulse: 92 (Regular) Resp.: 16 (Unlabored) BP: 128/82 (Sitting, Left Arm, Standard)   Physical Exam The physical exam findings are as follows:   General Mental Status - Alert, cooperative and good historian. General Appearance- pleasant. Not in acute distress. Orientation- Oriented X3. Build & Nutrition- Well nourished and Well developed.   Head and Neck Head- normocephalic, atraumatic . Neck Global Assessment- supple. no bruit auscultated on the right and no bruit auscultated on the left.   Eye Vision- Wears corrective lenses. Pupil- Bilateral- Regular and Round. Motion- Bilateral- EOMI.   Chest and Lung Exam Auscultation: Breath sounds:- clear at anterior chest wall and - clear at posterior chest wall. Adventitious sounds:- No Adventitious sounds.   Cardiovascular Auscultation:Rhythm- Regular rate and rhythm. Heart Sounds- S1 WNL and S2 WNL. Murmurs & Other Heart Sounds:Auscultation of the  heart reveals - No Murmurs.   Abdomen Palpation/Percussion:Tenderness- Abdomen is non-tender to palpation. Rigidity (guarding)- Abdomen is soft. Auscultation:Auscultation of the abdomen reveals - Bowel sounds normal.   Female Genitourinary Not done, not pertinent to present illness  Musculoskeletal On exam she is alert and oriented in no apparent distress. Her right hip shows flexion to 80, no internal or external rotation, only about 10 degrees of abduction. Left hip flexion about 95, about 10 internal rotation, 10 external rotation, 20 abduction with pain. Knee exam is normal. Pulses, sensation and motor are intact both lower extremities.  RADIOGRAPHS: AP pelvis, lateral both hips show severe endstage arthritis both hips, right somewhat worse than the left with large subchondral cystic formation and large degenerative cysts in the right femoral head. She has some erosion of the femoral head on the right.   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 Wynelle Link.  Plan is to go to SNF - [facility] in [place], [place]  PCP - Dr. Merryl Hacker [facility]  The patient will not receive TXA (tranexamic acid) due to: Arterial Disease  Signed electronically by Joelene Millin, III PA-C

## 2013-10-17 LAB — CBC
HCT: 30.1 % — ABNORMAL LOW (ref 36.0–46.0)
Hemoglobin: 9.9 g/dL — ABNORMAL LOW (ref 12.0–15.0)
MCH: 30.7 pg (ref 26.0–34.0)
MCHC: 32.9 g/dL (ref 30.0–36.0)
MCV: 93.2 fL (ref 78.0–100.0)
PLATELETS: 175 10*3/uL (ref 150–400)
RBC: 3.23 MIL/uL — ABNORMAL LOW (ref 3.87–5.11)
RDW: 12.7 % (ref 11.5–15.5)
WBC: 7.6 10*3/uL (ref 4.0–10.5)

## 2013-10-17 LAB — BASIC METABOLIC PANEL
BUN: 23 mg/dL (ref 6–23)
CALCIUM: 8.2 mg/dL — AB (ref 8.4–10.5)
CHLORIDE: 103 meq/L (ref 96–112)
CO2: 27 mEq/L (ref 19–32)
CREATININE: 1.11 mg/dL — AB (ref 0.50–1.10)
GFR, EST AFRICAN AMERICAN: 59 mL/min — AB (ref >90)
GFR, EST NON AFRICAN AMERICAN: 51 mL/min — AB (ref >90)
Glucose, Bld: 152 mg/dL — ABNORMAL HIGH (ref 70–99)
Potassium: 4.8 mEq/L (ref 3.7–5.3)
Sodium: 137 mEq/L (ref 137–147)

## 2013-10-17 MED ORDER — SODIUM CHLORIDE 0.9 % IV BOLUS (SEPSIS)
250.0000 mL | Freq: Once | INTRAVENOUS | Status: AC
  Administered 2013-10-17: 250 mL via INTRAVENOUS

## 2013-10-17 NOTE — Progress Notes (Signed)
Clinical Social Work Department CLINICAL SOCIAL WORK PLACEMENT NOTE 10/17/2013  Patient:  [name]  Account Number:  [id number] [place] date:  10/16/2013  Clinical Social Worker:  Werner Lean, LCSW  Date/time:  10/17/2013 12:00 M  Clinical Social Work is seeking post-discharge placement for this patient at the following level of care:   SKILLED NURSING   (*CSW will update this form in Epic as items are completed)     Patient/family provided with [facility] Department of Clinical Social Work's list of facilities offering this level of care within the geographic area requested by the patient (or if unable, by the patient's family).  10/17/2013  Patient/family informed of their freedom to choose among providers that offer the needed level of care, that participate in Medicare, Medicaid or managed care program needed by the patient, have an available bed and are willing to accept the patient.    Patient/family informed of [facility]' ownership interest in [facility], as well as of the fact that they are under no obligation to receive care at this facility.  PASARR submitted to EDS on 10/17/2013 PASARR number received from EDS on 10/17/2013  FL2 transmitted to all facilities in geographic area requested by pt/family on  10/17/2013 FL2 transmitted to all facilities within larger geographic area on   Patient informed that his/her managed care company has contracts with or will negotiate with  certain facilities, including the following:     Patient/family informed of bed offers received:  10/17/2013 Patient chooses bed at [facility] Physician recommends and patient chooses bed at    Patient to be transferred to  on   Patient to be transferred to facility by   The following physician request were entered in Epic:   Additional Comments:  Werner Lean LCSW [phone]

## 2013-10-17 NOTE — Evaluation (Signed)
Physical Therapy Evaluation Patient Details Name: Tracy Skinner MRN: [id number] DOB: Oct 13, 1945 Today's Date: 10/17/2013 Time: 8546-2703 PT Time Calculation (min): 30 min  PT Assessment / Plan / Recommendation History of Present Illness     Clinical Impression  Pt s/p R THR presents with decreased R LE strength/ROM, post op pain and deconditioned premorbid status limiting functional mobility.  Pt would benefit from follow up rehab at SNF level to maximize IND and safety prior to return home    PT Assessment       Follow Up Recommendations  SNF    Does the patient have the potential to tolerate intense rehabilitation      Barriers to Discharge        Equipment Recommendations  None recommended by PT    Recommendations for Other Services OT consult   Frequency      Precautions / Restrictions Precautions Precautions: Fall Precaution Comments: Pts LEs buckling several times with OOB Restrictions Weight Bearing Restrictions: No Other Position/Activity Restrictions: WBAT   Pertinent Vitals/Pain 6/10; declined pain meds this am; ice pack provided.  BP 84/61 - RN aware      Mobility  Bed Mobility Overal bed mobility: +2 for physical assistance;+ 2 for safety/equipment General bed mobility comments: cues for sequence, use of L LE to self assist and physical assist to bring trunk to upright and to bring to EOB using pad Transfers Overall transfer level: Needs assistance Equipment used: Rolling walker (2 wheeled) Transfers: Sit to/from Stand Sit to Stand: +2 physical assistance;+2 safety/equipment;Max assist General transfer comment: cues for LE management and use of UEs to self assist.  LE buckling in standing with assist x2 to prevent fall Ambulation/Gait Ambulation/Gait assistance: +2 physical assistance;+2 safety/equipment;Max assist Ambulation Distance (Feet): 1 Feet Assistive device: Rolling walker (2 wheeled) Gait Pattern/deviations: Step-to pattern;Decreased step  length - right;Decreased step length - left;Trunk flexed;Shuffle General Gait Details: Unable to tolerate sufficiently WB on R LE and UEs to advance L LE.  Ltd by c/o "passing out" but denies dizziness.  BP 84/61    Exercises Total Joint Exercises Ankle Circles/Pumps: AROM;Both;15 reps;Supine Quad Sets: AROM;Both;10 reps;Supine Heel Slides: AAROM;15 reps;Right;Supine Hip ABduction/ADduction: AAROM;Right;15 reps;Supine   PT Diagnosis:    PT Problem List:   PT Treatment Interventions:       PT Goals(Current goals can be found in the care plan section) Acute Rehab PT Goals Patient Stated Goal: Rehab and home to resume previous lifestyle with decreased pain PT Goal Formulation: With patient Time For Goal Achievement: 10/24/13 Potential to Achieve Goals: Good  Visit Information  Last PT Received On: 10/17/13 Assistance Needed: +2       Prior Functioning  Home Living Family/patient expects to be discharged to:: Skilled nursing facility Living Arrangements: Spouse/significant other Prior Function Level of Independence: Independent with assistive device(s) Communication Communication: No difficulties Dominant Hand: Right    Cognition  Cognition Arousal/Alertness: Awake/alert Behavior During Therapy: WFL for tasks assessed/performed Overall Cognitive Status: Within Functional Limits for tasks assessed    Extremity/Trunk Assessment Upper Extremity Assessment Upper Extremity Assessment: Overall WFL for tasks assessed Lower Extremity Assessment Lower Extremity Assessment: RLE deficits/detail RLE Deficits / Details: Hip strength 2/5 with min quads and with AAROM at hip to 90 flex and 15 abd Cervical / Trunk Assessment Cervical / Trunk Assessment: Normal   Balance    End of Session PT - End of Session Equipment Utilized During Treatment: Gait belt Activity Tolerance: Patient limited by fatigue;Other (comment) Patient left: in chair;with call  bell/phone within reach Nurse  Communication: Mobility status;Other (comment) (BP)  GP     Xaria Judon 10/17/2013, 12:34 PM

## 2013-10-17 NOTE — Progress Notes (Signed)
Physical Therapy Treatment Patient Details Name: Tracy Skinner MRN: [id number] DOB: 07/19/1946 Today's Date: 10/17/2013 Time: 3762-8315 PT Time Calculation (min): 16 min  PT Assessment / Plan / Recommendation  History of Present Illness     PT Comments   Pt continues cooperative but unsafe requiring assist of 2 for OOB 2* multiple instances Bil LE buckling requiring max assist to avoid fall.  Follow Up Recommendations  SNF     Does the patient have the potential to tolerate intense rehabilitation     Barriers to Discharge        Equipment Recommendations  None recommended by PT    Recommendations for Other Services OT consult  Frequency 7X/week   Progress towards PT Goals Progress towards PT goals: Progressing toward goals  Plan Current plan remains appropriate    Precautions / Restrictions Precautions Precautions: Fall Precaution Comments: Pts LEs buckling several times with OOB Restrictions Weight Bearing Restrictions: No Other Position/Activity Restrictions: WBAT   Pertinent Vitals/Pain     Mobility  Bed Mobility Overal bed mobility: +2 for physical assistance;+ 2 for safety/equipment;Needs Assistance Bed Mobility: Sit to Supine Sit to supine: +2 for physical assistance;Max assist General bed mobility comments: cues for sequence, use of L LE to self assist and physical assist to bring trunk to upright and to bring to EOB using pad Transfers Overall transfer level: Needs assistance Equipment used: Rolling walker (2 wheeled) Transfers: Sit to/from Stand Sit to Stand: +2 physical assistance;+2 safety/equipment;Max assist General transfer comment: cues for LE management and use of UEs to self assist.  LE buckling in standing with assist x2 to prevent fall Ambulation/Gait Ambulation/Gait assistance: +2 physical assistance;Max assist Ambulation Distance (Feet): 1 Feet Assistive device: Rolling walker (2 wheeled) Gait Pattern/deviations: Step-to pattern;Decreased step  length - right;Decreased step length - left;Shuffle;Antalgic;Trunk flexed General Gait Details: Unable to tolerate sufficient WB on R LE and UEs to advance L LE.  Ltd by c/o "passing out" but denies dizziness.  BP 84/61    Exercises     PT Diagnosis:    PT Problem List:   PT Treatment Interventions:     PT Goals (current goals can now be found in the care plan section) Acute Rehab PT Goals Patient Stated Goal: Rehab and home to resume previous lifestyle with decreased pain PT Goal Formulation: With patient Time For Goal Achievement: 10/24/13 Potential to Achieve Goals: Good  Visit Information  Last PT Received On: 10/17/13 Assistance Needed: +2    Subjective Data  Subjective: Feeling better than am. Patient Stated Goal: Rehab and home to resume previous lifestyle with decreased pain   Cognition  Cognition Arousal/Alertness: Awake/alert Behavior During Therapy: WFL for tasks assessed/performed Overall Cognitive Status: Within Functional Limits for tasks assessed    Balance     End of Session PT - End of Session Equipment Utilized During Treatment: Gait belt Activity Tolerance: Patient limited by fatigue;Other (comment) Patient left: in bed;in CPM Nurse Communication: Mobility status;Other (comment)   GP     Tracy Skinner 10/17/2013, 4:07 PM

## 2013-10-17 NOTE — Progress Notes (Signed)
   Subjective: 1 Day Post-Op Procedure(s) (LRB): RIGHT TOTAL HIP ARTHROPLASTY ANTERIOR APPROACH (Right) Patient reports pain as mild.   Patient seen in rounds with Dr. Wynelle Link. Patient is well, and has had no acute complaints or problems We will start therapy today.  Plan is to go Skilled nursing facility after hospital stay.  Objective: Vital signs in last 24 hours: Temp:  [97.2 F (36.2 C)-98.8 F (37.1 C)] 98.8 F (37.1 C) (02/11 1345) Pulse Rate:  [73-88] 83 (02/11 1345) Resp:  [12-16] 14 (02/11 1345) BP: (97-115)/(66-76) 102/69 mmHg (02/11 1345) SpO2:  [96 %-100 %] 98 % (02/11 1345) Weight:  [73.483 kg (162 lb)] 73.483 kg (162 lb) (02/10 1726)  Intake/Output from previous day:  Intake/Output Summary (Last 24 hours) at 10/17/13 1723 Last data filed at 10/17/13 0700  Gross per 24 hour  Intake   1120 ml  Output    815 ml  Net    305 ml    Intake/Output this shift:    Labs:  Recent Labs  10/17/13 0450  HGB 9.9*    Recent Labs  10/17/13 0450  WBC 7.6  RBC 3.23*  HCT 30.1*  PLT 175    Recent Labs  10/17/13 0450  NA 137  K 4.8  CL 103  CO2 27  BUN 23  CREATININE 1.11*  GLUCOSE 152*  CALCIUM 8.2*   No results found for this basename: LABPT, INR,  in the last 72 hours  EXAM General - Patient is Alert, Appropriate and Oriented Extremity - Neurovascular intact Sensation intact distally Dressing - dressing C/D/I Motor Function - intact, moving foot and toes well on exam.   Past Medical History  Diagnosis Date  . Arthritis     oa hip  . Vitamin D deficiency   . Migraine   . Carotid artery aneurysm     lov note dr Kathlynn Grate 09-12-2013 medical clearance note on chart  . Atherosclerosis   . Cancer 2008    brain  . Hypertension   . 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 Brain tumor     high grade oligogastrocytoma  . High cholesterol   . Acute renal failure several months ago due to dehydration  . Bursitis   . Leukopenia   .  Osteopenia   . Seizures     last seizure 4 yrs ago    Assessment/Plan: 1 Day Post-Op Procedure(s) (LRB): RIGHT TOTAL HIP ARTHROPLASTY ANTERIOR APPROACH (Right) Principal Problem:   OA (osteoarthritis) of hip  Estimated body mass index is 28.7 kg/(m^2) as calculated from the following:   Height as of this encounter: 5\' 3"  (1.6 m).   Weight as of this encounter: 73.483 kg (162 lb). Advance diet Up with therapy Discharge to SNF  DVT Prophylaxis - Xarelto Weight Bearing As Tolerated right Leg   PERKINS, ALEXZANDREW 10/17/2013, 5:23 PM

## 2013-10-17 NOTE — Progress Notes (Signed)
Clinical Social Work Department BRIEF PSYCHOSOCIAL ASSESSMENT 10/17/2013  Patient:  [facility]     Account Number:  [id number]     Admit date:  10/16/2013  Clinical Social Worker:  Lacie Scotts  Date/Time:  10/17/2013 11:56 AM  Referred by:  Physician  Date Referred:  10/17/2013 Referred for  Substance Abuse   Other Referral:   Interview type:  Patient Other interview type:    PSYCHOSOCIAL DATA Living Status:  HUSBAND Admitted from facility:   Level of care:   Primary support name:  [facility] Primary support relationship to patient:  SPOUSE Degree of support available:   supportive    CURRENT CONCERNS Current Concerns  Post-Acute Placement   Other Concerns:    SOCIAL WORK ASSESSMENT / PLAN Pt is 68 yr old female living at home prior to hospitalization. CSW met with pt and spoke to spouse to assist with d/c planning. pt will need ST Rehab following hospital d/c. Pt / family have requested placement at [facility] in [place] has contacted SNF and clinicals have been sent. SNF will begin [facility] authorization process once PT / OT evals are available. CSW will continue to follow to asist with d/c planning.   Assessment/plan status:  Psychosocial Support/Ongoing Assessment of Needs Other assessment/ plan:   Information/referral to community resources:   Insurance coverage for SNF and ambulance transport reviewed. Family is aware that insurance will not cover cost of transport to SNF.    PATIENT'S/FAMILY'S RESPONSE TO PLAN OF CARE: Pt / spouse are hopefull that insurance will authorize placement at [facility]. It is very costly to send pt by non emergency ambulance ( $ 1,[phone] ). " I hope we can go by car, but we'll do what's recommended. " Pt / spouse have been very pleased with care / assistance  received.   Werner Lean LCSW [phone]

## 2013-10-17 NOTE — Plan of Care (Signed)
Problem: Consults Goal: Diagnosis- Total Joint Replacement Outcome: Completed/Met Date Met:  10/17/13 Primary Total Hip RIGHT, Anterior

## 2013-10-18 LAB — CBC
HEMATOCRIT: 31 % — AB (ref 36.0–46.0)
Hemoglobin: 10.4 g/dL — ABNORMAL LOW (ref 12.0–15.0)
MCH: 31.3 pg (ref 26.0–34.0)
MCHC: 33.5 g/dL (ref 30.0–36.0)
MCV: 93.4 fL (ref 78.0–100.0)
PLATELETS: 175 10*3/uL (ref 150–400)
RBC: 3.32 MIL/uL — ABNORMAL LOW (ref 3.87–5.11)
RDW: 13.1 % (ref 11.5–15.5)
WBC: 10.4 10*3/uL (ref 4.0–10.5)

## 2013-10-18 LAB — BASIC METABOLIC PANEL
BUN: 15 mg/dL (ref 6–23)
CO2: 27 mEq/L (ref 19–32)
Calcium: 8.7 mg/dL (ref 8.4–10.5)
Chloride: 107 mEq/L (ref 96–112)
Creatinine, Ser: 1.02 mg/dL (ref 0.50–1.10)
GFR, EST AFRICAN AMERICAN: 65 mL/min — AB (ref >90)
GFR, EST NON AFRICAN AMERICAN: 56 mL/min — AB (ref >90)
Glucose, Bld: 109 mg/dL — ABNORMAL HIGH (ref 70–99)
POTASSIUM: 3.7 meq/L (ref 3.7–5.3)
Sodium: 143 mEq/L (ref 137–147)

## 2013-10-18 MED ORDER — PANTOPRAZOLE SODIUM 40 MG PO TBEC: 40.0000 mg | DELAYED_RELEASE_TABLET | Freq: Every day | ORAL | Status: DC

## 2013-10-18 MED ORDER — PANTOPRAZOLE SODIUM 40 MG PO TBEC
40.0000 mg | DELAYED_RELEASE_TABLET | Freq: Every day | ORAL | Status: DC
  Administered 2013-10-18 – 2013-10-19 (×2): 40 mg via ORAL
  Filled 2013-10-18 (×2): qty 1

## 2013-10-18 NOTE — Evaluation (Signed)
Occupational Therapy Evaluation Patient Details Name: Tracy Skinner MRN: [id number] DOB: Dec 28, 1945 Today's Date: 10/18/2013 Time: 4098-1191 OT Time Calculation (min): 42 min  OT Assessment / Plan / Recommendation History of present illness Pt was admitted for R DA THA.  She has a h/o brain tumor and seizures   Clinical Impression   Pt was admitted for the above. She will benefit from skilled OT to increase safety and independence with adls. Pt has generalized decreased strength and endurance.   Pt needs A x 2 for safety; goals in acute are set for min to mod A.      OT Assessment  Patient needs continued OT Services    Follow Up Recommendations  SNF    Barriers to Discharge      Equipment Recommendations  3 in 1 bedside comode    Recommendations for Other Services    Frequency  Min 2X/week    Precautions / Restrictions Precautions Precautions: Fall Precaution Comments: Bil knees buckle; pt sits prematurely Restrictions Other Position/Activity Restrictions: WBAT   Pertinent Vitals/Pain C/o pain in groin:  Requested tylenol which RN brought.  Repositioned and ice applied    ADL  Grooming: Set up;Wash/dry hands;Wash/dry face Where Assessed - Grooming: Unsupported sitting Upper Body Bathing: Set up Where Assessed - Upper Body Bathing: Unsupported sitting Lower Body Bathing: +2 Total assistance Lower Body Bathing: Patient Percentage: 30% Where Assessed - Lower Body Bathing: Supported sit to stand Upper Body Dressing: Minimal assistance (iv) Where Assessed - Upper Body Dressing: Unsupported sitting Lower Body Dressing: +2 Total assistance Lower Body Dressing: Patient Percentage: 10% Where Assessed - Lower Body Dressing: Supported sit to stand Toilet Transfer: +2 Total assistance Toilet Transfer: Patient Percentage: 50% Toilet Transfer Method: Arts development officer: Therapist, occupational and Hygiene: +2 Total  assistance Toileting - Water quality scientist and Hygiene: Patient Percentage: 0% Where Assessed - Best boy and Hygiene: Sit to stand from 3-in-1 or toilet Equipment Used: Rolling walker Transfers/Ambulation Related to ADLs: SPT to [facility] then chair.  Pt had difficulty moving bil LEs, keeping trunk upright and she sat prematurely ADL Comments: Would benefit from AE; did not introduce on this visit    OT Diagnosis: Generalized weakness  OT Problem List: Decreased strength;Decreased activity tolerance;Impaired balance (sitting and/or standing);Decreased cognition;Decreased knowledge of use of DME or AE;Pain OT Treatment Interventions: Self-care/ADL training;DME and/or AE instruction;Patient/family education;Balance training;Cognitive remediation/compensation   OT Goals(Current goals can be found in the care plan section) Acute Rehab OT Goals Patient Stated Goal: Rehab and home to resume previous lifestyle with decreased pain OT Goal Formulation: With patient Time For Goal Achievement: 10/25/13 Potential to Achieve Goals: Good ADL Goals Pt Will Perform Lower Body Bathing: with min assist;with adaptive equipment;sit to/from stand Pt Will Perform Lower Body Dressing: with mod assist;with adaptive equipment;sit to/from stand Pt Will Transfer to Toilet: with min assist;stand pivot transfer;bedside commode Pt Will Perform Toileting - Clothing Manipulation and hygiene: with min assist;sit to/from stand Additional ADL Goal #1: pt will not need any more than 1 safety cue during toilet transfers  Visit Information  Last OT Received On: 10/18/13 Assistance Needed: +2 History of Present Illness: Pt was admitted for R DA THA.  She has a h/o brain tumor and seizures       Prior [place] expects to be discharged to:: Skilled nursing facility Living Arrangements: Spouse/significant other Prior Function Level of Independence: Independent with  assistive device(s) Dominant Hand:  Right         Vision/Perception     Cognition  Cognition Arousal/Alertness: Awake/alert Behavior During Therapy: WFL for tasks assessed/performed Overall Cognitive Status:  (decreased safety; extra time to process at times)    Extremity/Trunk Assessment Upper Extremity Assessment Upper Extremity Assessment: Generalized weakness;LUE deficits/detail (has bil tremors) LUE Deficits / Details: movement slower than R LUE Sensation:  (c/o numbness)     Mobility Bed Mobility Supine to sit: +2 for physical assistance;Min assist;Mod assist;+2 for safety/equipment General bed mobility comments: cues for sequence and use of arms to push up Transfers Transfers: Sit to/from Stand Sit to Stand: +2 physical assistance;+2 safety/equipment;Mod assist General transfer comment: cues for hand placement, keeping trunk erect, sequence with steps and safety     Exercise     Balance     End of Session OT - End of Session Activity Tolerance: Patient tolerated treatment well Patient left: in chair;with call bell/phone within reach Nurse Communication: Mobility status  GO     Tracy Skinner 10/18/2013, 9:03 AM Lesle Chris, OTR/L [phone] 10/18/2013

## 2013-10-18 NOTE — Care Management Note (Addendum)
    Page 1 of 1   10/19/2013     4:56:21 PM   CARE MANAGEMENT NOTE 10/19/2013  Patient:  [facility]   Account Number:  [id number]  Date Initiated:  10/16/2013  Documentation initiated by:  Sherrin Daisy  Subjective/Objective Assessment:   dx rt anterior hip replacemnt     Action/Plan:   CM spoke with patient. Plans are for SNF rehab.   Anticipated DC Date:  10/18/2013   Anticipated DC Plan:  SKILLED NURSING FACILITY  In-house referral  Clinical Social Worker      DC Planning Services  CM consult      Choice offered to / List presented to:             Status of service:  Completed, signed off Medicare Important Message given?   (If response is "NO", the following Medicare IM given date fields will be blank) Date Medicare IM given:   Date Additional Medicare IM given:    Discharge Disposition:    Per UR Regulation:  Reviewed for med. necessity/level of care/duration of stay  If discussed at [place] of Stay Meetings, dates discussed:    Comments:

## 2013-10-18 NOTE — Progress Notes (Signed)
   Subjective: 2 Days Post-Op Procedure(s) (LRB): RIGHT TOTAL HIP ARTHROPLASTY ANTERIOR APPROACH (Right) Patient reports pain as mild.   Patient seen in rounds with Dr. Wynelle Link.  Has complaints of reflux.  Added protonix. Patient is well, but has had some minor complaints of pain in the hip, requiring pain medications and reflux symptoms We will start therapy today.  Plan is to go Skilled nursing facility after hospital stay, [facility] in [place], [place].  Objective: Vital signs in last 24 hours: Temp:  [98.3 F (36.8 C)-99.2 F (37.3 C)] 99.2 F (37.3 C) (02/12 8315) Pulse Rate:  [83-98] 98 (02/12 0638) Resp:  [14-15] 14 (02/12 0638) BP: (102-147)/(69-83) 147/83 mmHg (02/12 0638) SpO2:  [96 %-98 %] 96 % (02/12 1761)  Intake/Output from previous day:  Intake/Output Summary (Last 24 hours) at 10/18/13 0736 Last data filed at 10/18/13 6073  Gross per 24 hour  Intake 2236.52 ml  Output   2400 ml  Net -163.48 ml    Labs:  Recent Labs  10/17/13 0450 10/18/13 0504  HGB 9.9* 10.4*    Recent Labs  10/17/13 0450 10/18/13 0504  WBC 7.6 10.4  RBC 3.23* 3.32*  HCT 30.1* 31.0*  PLT 175 175    Recent Labs  10/17/13 0450 10/18/13 0504  NA 137 143  K 4.8 3.7  CL 103 107  CO2 27 27  BUN 23 15  CREATININE 1.11* 1.02  GLUCOSE 152* 109*  CALCIUM 8.2* 8.7   No results found for this basename: LABPT, INR,  in the last 72 hours  EXAM General - Patient is Alert and Appropriate Extremity - Neurovascular intact Sensation intact distally Dressing - dressing C/D/I Motor Function - intact, moving foot and toes well on exam.   Past Medical History  Diagnosis Date  . Arthritis     oa hip  . Vitamin D deficiency   . Migraine   . Carotid artery aneurysm     lov note dr Kathlynn Grate 09-12-2013 medical clearance note on chart  . Atherosclerosis   . Cancer 2008    brain  . Hypertension   . 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 Brain tumor     high grade  oligogastrocytoma  . High cholesterol   . Acute renal failure several months ago due to dehydration  . Bursitis   . Leukopenia   . Osteopenia   . Seizures     last seizure 4 yrs ago    Assessment/Plan: 2 Days Post-Op Procedure(s) (LRB): RIGHT TOTAL HIP ARTHROPLASTY ANTERIOR APPROACH (Right) Principal Problem:   OA (osteoarthritis) of hip  Estimated body mass index is 28.7 kg/(m^2) as calculated from the following:   Height as of this encounter: 5\' 3"  (1.6 m).   Weight as of this encounter: 73.483 kg (162 lb). Up with therapy Plan for discharge tomorrow Discharge to SNF  DVT Prophylaxis - Xarelto Weight Bearing As Tolerated right Leg   Tracy Skinner 10/18/2013, 7:36 AM

## 2013-10-18 NOTE — Progress Notes (Signed)
Physical Therapy Treatment Patient Details Name: Tracy Skinner MRN: [id number] DOB: 30-Jan-1946 Today's Date: 10/18/2013 Time: 6063-0160 PT Time Calculation (min): 24 min  PT Assessment / Plan / Recommendation  History of Present Illness Pt was admitted for R DA THA.  She has a h/o brain tumor and seizures   PT Comments   Pt continues cooperative but requiring constant step-by-step cues (with fluctuating level of follow through) for task completion.    Follow Up Recommendations  SNF     Does the patient have the potential to tolerate intense rehabilitation     Barriers to Discharge        Equipment Recommendations  None recommended by PT    Recommendations for Other Services OT consult  Frequency 7X/week   Progress towards PT Goals Progress towards PT goals: Progressing toward goals  Plan Current plan remains appropriate    Precautions / Restrictions Precautions Precautions: Fall Precaution Comments: Bil knees buckle; pt sits prematurely Restrictions Weight Bearing Restrictions: No Other Position/Activity Restrictions: WBAT   Pertinent Vitals/Pain 10/10 but smiling; RN aware; ice packs provided    Mobility  Bed Mobility Overal bed mobility: +2 for physical assistance;+ 2 for safety/equipment;Needs Assistance Bed Mobility: Sit to Supine Sit to supine: +2 for physical assistance;Mod assist General bed mobility comments: cues for sequencing and to manage lateral shifts to center of bed Transfers Overall transfer level: Needs assistance Equipment used: Rolling walker (2 wheeled) Transfers: Sit to/from Stand Sit to Stand: +2 physical assistance;+2 safety/equipment;Mod assist General transfer comment: cues for hand placement, keeping trunk erect, sequence with steps and safety Ambulation/Gait Ambulation/Gait assistance: +2 physical assistance;+2 safety/equipment;Max assist Ambulation Distance (Feet): 7 Feet (and 2) Assistive device: Rolling walker (2 wheeled) Gait  Pattern/deviations: Step-to pattern;Decreased step length - right;Decreased step length - left;Shuffle;Trunk flexed;Antalgic Gait velocity: decr General Gait Details: Constant step by step cues for sequence, posture, UE WB, foot placement, stride length, position from RW.  Pt with fluctuating ability to follow cues and several episodes LE buckling but improvement over last session    Exercises     PT Diagnosis:    PT Problem List:   PT Treatment Interventions:     PT Goals (current goals can now be found in the care plan section) Acute Rehab PT Goals Patient Stated Goal: Rehab and home to resume previous lifestyle with decreased pain PT Goal Formulation: With patient Time For Goal Achievement: 10/24/13 Potential to Achieve Goals: Good  Visit Information  Last PT Received On: 10/18/13 Assistance Needed: +2 History of Present Illness: Pt was admitted for R DA THA.  She has a h/o brain tumor and seizures    Subjective Data  Subjective: It hurts Patient Stated Goal: Rehab and home to resume previous lifestyle with decreased pain   Cognition  Cognition Arousal/Alertness: Awake/alert Behavior During Therapy: WFL for tasks assessed/performed Overall Cognitive Status: Within Functional Limits for tasks assessed    Balance     End of Session PT - End of Session Equipment Utilized During Treatment: Gait belt Activity Tolerance: Patient limited by fatigue;Patient limited by pain Patient left: in bed Nurse Communication: Mobility status;Other (comment)   GP     Tracy Skinner 10/18/2013, 12:27 PM

## 2013-10-18 NOTE — Progress Notes (Signed)
Physical Therapy Treatment Patient Details Name: Tracy Skinner MRN: [id number] DOB: Aug 20, 1946 Today's Date: 10/18/2013 Time: 4098-1191 PT Time Calculation (min): 14 min  PT Assessment / Plan / Recommendation  History of Present Illness Pt was admitted for R DA THA.  She has a h/o brain tumor and seizures   PT Comments     Follow Up Recommendations  SNF     Does the patient have the potential to tolerate intense rehabilitation     Barriers to Discharge        Equipment Recommendations  None recommended by PT    Recommendations for Other Services OT consult  Frequency 7X/week   Progress towards PT Goals Progress towards PT goals: Progressing toward goals  Plan Current plan remains appropriate    Precautions / Restrictions Precautions Precautions: Fall Precaution Comments: Bil knees buckle; pt sits prematurely Restrictions Weight Bearing Restrictions: No Other Position/Activity Restrictions: WBAT   Pertinent Vitals/Pain     Mobility  Bed Mobility Overal bed mobility: Needs Assistance;+2 for physical assistance Bed Mobility: Sit to Supine Supine to sit: +2 for physical assistance;Mod assist Sit to supine: +2 for physical assistance;Mod assist General bed mobility comments: cues for sequencing and use of L LE to self assist.  Physical assist for R LE and to bring pt to EOB using pad Transfers Overall transfer level: Needs assistance Equipment used: Rolling walker (2 wheeled) Transfers: Sit to/from Stand Sit to Stand: +2 physical assistance;Mod assist General transfer comment: cues for hand placement, keeping trunk erect, sequence with steps and safety Ambulation/Gait Ambulation/Gait assistance: +2 physical assistance;Total assist;Mod assist Ambulation Distance (Feet): 5 Feet Assistive device: Rolling walker (2 wheeled) Gait Pattern/deviations: Step-to pattern;Decreased step length - right;Decreased step length - left;Shuffle;Antalgic;Trunk flexed Gait velocity:  decr General Gait Details: Constant step by step cues for sequence, posture, UE WB, foot placement, stride length, position from RW.  Pt with fluctuating ability to follow cues and several episodes LE buckling but improvement over last session    Exercises     PT Diagnosis:    PT Problem List:   PT Treatment Interventions:     PT Goals (current goals can now be found in the care plan section) Acute Rehab PT Goals Patient Stated Goal: Rehab and home to resume previous lifestyle with decreased pain PT Goal Formulation: With patient Time For Goal Achievement: 10/24/13 Potential to Achieve Goals: Good  Visit Information  Last PT Received On: 10/18/13 Assistance Needed: +2 History of Present Illness: Pt was admitted for R DA THA.  She has a h/o brain tumor and seizures    Subjective Data  Subjective: It hurts Patient Stated Goal: Rehab and home to resume previous lifestyle with decreased pain   Cognition  Cognition Arousal/Alertness: Awake/alert Behavior During Therapy: WFL for tasks assessed/performed Overall Cognitive Status: Within Functional Limits for tasks assessed    Balance     End of Session PT - End of Session Equipment Utilized During Treatment: Gait belt Activity Tolerance: Patient tolerated treatment well Patient left: with call bell/phone within reach;with nursing/sitter in room;in bed Nurse Communication: Mobility status;Other (comment)   GP     Marvelous Bouwens 10/18/2013, 4:13 PM

## 2013-10-18 NOTE — Discharge Instructions (Addendum)
°Dr. Frank Aluisio °Total Joint Specialist °[facility] °[address] °[place], [place] [address] °[phone] ° ° ° °ANTERIOR APPROACH TOTAL HIP REPLACEMENT POSTOPERATIVE DIRECTIONS ° ° °Hip Rehabilitation, Guidelines Following Surgery  °The results of a hip operation are greatly improved after range of motion and muscle strengthening exercises. Follow all safety measures which are given to protect your hip. If any of these exercises cause increased pain or swelling in your joint, decrease the amount until you are comfortable again. Then slowly increase the exercises. Call your caregiver if you have problems or questions.  °HOME CARE INSTRUCTIONS  °Most of the following instructions are designed to prevent the dislocation of your new hip.  °Remove items at home which could result in a fall. This includes throw rugs or furniture in walking pathways.  °Continue medications as instructed at time of discharge. °· You may have some home medications which will be placed on hold until you complete the course of blood thinner medication. °· You may start showering once you are discharged home but do not submerge the incision under water. Just pat the incision dry and apply a dry gauze dressing on daily. °Do not put on socks or shoes without following the instructions of your caregivers.  °Sit on high chairs which makes it easier to stand.  °Sit on chairs with arms. Use the chair arms to help push yourself up when arising.  °Keep your leg on the side of the operation out in front of you when standing up.  °Arrange for the use of a toilet seat elevator so you are not sitting low.   °· Walk with walker as instructed.  °You may resume a sexual relationship in one month or when given the OK by your caregiver.  °Use walker as long as suggested by your caregivers.  °You may put full weight on your legs and walk as much as is comfortable. °Avoid periods of inactivity such as sitting longer than an hour  when not asleep. This helps prevent blood clots.  °You may return to work once you are cleared by your surgeon.  °Do not drive a car for 6 weeks or until released by your surgeon.  °Do not drive while taking narcotics.  °Wear elastic stockings for three weeks following surgery during the day but you may remove then at night.  °Make sure you keep all of your appointments after your operation with all of your doctors and caregivers. You should call the office at the above phone number and make an appointment for approximately two weeks after the date of your surgery. °Change the dressing daily and reapply a dry dressing each time. °Please pick up a stool softener and laxative for home use as long as you are requiring pain medications. °· Continue to use ice on the hip for pain and swelling from surgery. You may notice swelling that will progress down to the foot and ankle.  This is normal after  surgery.  Elevate the leg when you are not up walking on it.   °It is important for you to complete the blood thinner medication as prescribed by your doctor. °· Continue to use the breathing machine which will help keep your temperature down.  It is common for your temperature to cycle up and down following surgery, especially at night when you are not up moving around and exerting yourself.  The breathing machine keeps your lungs expanded and your temperature down. ° °RANGE OF MOTION AND STRENGTHENING EXERCISES  °  These exercises are designed to help you keep full movement of your hip joint. Follow your caregiver's or physical therapist's instructions. Perform all exercises about fifteen times, three times per day or as directed. Exercise both hips, even if you have had only one joint replacement. These exercises can be done on a training (exercise) mat, on the floor, on a table or on a bed. Use whatever works the best and is most comfortable for you. Use music or television while you are exercising so that the exercises are  a pleasant break in your day. This will make your life better with the exercises acting as a break in routine you can look forward to.  Lying on your back, slowly slide your foot toward your buttocks, raising your knee up off the floor. Then slowly slide your foot back down until your leg is straight again.  Lying on your back spread your legs as far apart as you can without causing discomfort.  Lying on your side, raise your upper leg and foot straight up from the floor as far as is comfortable. Slowly lower the leg and repeat.  Lying on your back, tighten up the muscle in the front of your thigh (quadriceps muscles). You can do this by keeping your leg straight and trying to raise your heel off the floor. This helps strengthen the largest muscle supporting your knee.  Lying on your back, tighten up the muscles of your buttocks both with the legs straight and with the knee bent at a comfortable angle while keeping your heel on the floor.   SKILLED REHAB INSTRUCTIONS: If the patient is transferred to a skilled rehab facility following release from the hospital, a list of the current medications will be sent to the facility for the patient to continue.  When discharged from the skilled rehab facility, please have the facility set up the patient's [place] prior to being released. Also, the skilled facility will be responsible for providing the patient with their medications at time of release from the facility to include their pain medication, the muscle relaxants, and their blood thinner medication. If the patient is still at the rehab facility at time of the two week follow up appointment, the skilled rehab facility will also need to assist the patient in arranging follow up appointment in our office and any transportation needs.  MAKE SURE YOU:  Understand these instructions.  Will watch your condition.  Will get help right away if you are not doing well or get worse.  Pick up  stool softner and laxative for home. Do not submerge incision under water. May shower. Continue to use ice for pain and swelling from surgery. Total Hip Protocol.  Take Xarelto for two and a half more weeks, then discontinue Xarelto. Once the patient has completed the blood thinner regimen, then take a Baby 81 mg Aspirin daily for three more weeks.  When discharged from the skilled rehab facility, please have the facility set up the patient's [place] prior to being released.  Also provide the patient with their medications at time of release from the facility to include their pain medication, the muscle relaxants, and their blood thinner medication.  If the patient is still at the rehab facility at time of follow up appointment, please also assist the patient in arranging follow up appointment in our office and any transportation needs.     Information on my medicine - XARELTO (Rivaroxaban)  This medication education was  reviewed with me or my healthcare representative as part of my discharge preparation.  The pharmacist that spoke with me during my hospital stay was:  Leighton Parody, [facility]  Why was Xarelto prescribed for you? Xarelto was prescribed for you to reduce the risk of blood clots forming after orthopedic surgery. The medical term for these abnormal blood clots is venous thromboembolism (VTE).  What do you need to know about xarelto ? Take your Xarelto ONCE DAILY at the same time every day. You may take it either with or without food.  If you have difficulty swallowing the tablet whole, you may crush it and mix in applesauce just prior to taking your dose.  Take Xarelto exactly as prescribed by your doctor and DO NOT stop taking Xarelto without talking to the doctor who prescribed the medication.  Stopping without other VTE prevention medication to take the place of Xarelto may increase your risk of developing a clot.  After discharge, you should  have regular check-up appointments with your healthcare provider that is prescribing your Xarelto.    What do you do if you miss a dose? If you miss a dose, take it as soon as you remember on the same day then continue your regularly scheduled once daily regimen the next day. Do not take two doses of Xarelto on the same day.   Important Safety Information A possible side effect of Xarelto is bleeding. You should call your healthcare provider right away if you experience any of the following:   Bleeding from an injury or your nose that does not stop.   Unusual colored urine (red or dark brown) or unusual colored stools (red or black).   Unusual bruising for unknown reasons.   A serious fall or if you hit your head (even if there is no bleeding).  Some medicines may interact with Xarelto and might increase your risk of bleeding while on Xarelto. To help avoid this, consult your healthcare provider or pharmacist prior to using any new prescription or non-prescription medications, including herbals, vitamins, non-steroidal anti-inflammatory drugs (NSAIDs) and supplements.  This website has more information on Xarelto: [email or web address]

## 2013-10-18 NOTE — Progress Notes (Signed)
Physical Therapy Treatment Patient Details Name: Tracy Skinner MRN: [id number] DOB: 1946/08/08 Today's Date: 10/18/2013 Time: 6073-7106 PT Time Calculation (min): 28 min  PT Assessment / Plan / Recommendation  History of Present Illness Pt was admitted for R DA THA.  She has a h/o brain tumor and seizures   PT Comments   Pt progressing slowly with constant cueing and increased time for all tasks  Follow Up Recommendations  SNF     Does the patient have the potential to tolerate intense rehabilitation     Barriers to Discharge        Equipment Recommendations  None recommended by PT    Recommendations for Other Services OT consult  Frequency 7X/week   Progress towards PT Goals Progress towards PT goals: Progressing toward goals  Plan Current plan remains appropriate    Precautions / Restrictions Precautions Precautions: Fall Precaution Comments: Bil knees buckle; pt sits prematurely Restrictions Weight Bearing Restrictions: No Other Position/Activity Restrictions: WBAT   Pertinent Vitals/Pain 6/10; ice pack provided; RN providing tylenol    Mobility  Bed Mobility Overal bed mobility: Needs Assistance;+2 for physical assistance Bed Mobility: Supine to Sit Supine to sit: +2 for physical assistance;Mod assist General bed mobility comments: cues for sequencing and use of L LE to self assist.  Physical assist for R LE and to bring pt to EOB using pad Transfers Overall transfer level: Needs assistance Equipment used: Rolling walker (2 wheeled) Transfers: Sit to/from Stand Sit to Stand: +2 physical assistance;Mod assist General transfer comment: cues for hand placement, keeping trunk erect, sequence with steps and safety Ambulation/Gait Ambulation/Gait assistance: +2 physical assistance;Mod assist Ambulation Distance (Feet): 23 Feet (and 3) Assistive device: Rolling walker (2 wheeled) Gait Pattern/deviations: Step-to pattern;Decreased step length - right;Decreased step  length - left;Shuffle;Antalgic;Trunk flexed Gait velocity: decr General Gait Details: Constant step by step cues for sequence, posture, UE WB, foot placement, stride length, position from RW.  Pt with fluctuating ability to follow cues and several episodes LE buckling but improvement over last session    Exercises     PT Diagnosis:    PT Problem List:   PT Treatment Interventions:     PT Goals (current goals can now be found in the care plan section) Acute Rehab PT Goals Patient Stated Goal: Rehab and home to resume previous lifestyle with decreased pain PT Goal Formulation: With patient Time For Goal Achievement: 10/24/13 Potential to Achieve Goals: Good  Visit Information  Last PT Received On: 10/18/13 Assistance Needed: +2 History of Present Illness: Pt was admitted for R DA THA.  She has a h/o brain tumor and seizures    Subjective Data  Subjective: It hurts Patient Stated Goal: Rehab and home to resume previous lifestyle with decreased pain   Cognition  Cognition Arousal/Alertness: Awake/alert Behavior During Therapy: WFL for tasks assessed/performed Overall Cognitive Status: Within Functional Limits for tasks assessed    Balance     End of Session PT - End of Session Equipment Utilized During Treatment: Gait belt Activity Tolerance: Patient tolerated treatment well Patient left: in chair;with call bell/phone within reach;with nursing/sitter in room Nurse Communication: Mobility status;Other (comment)   GP     Feliberto Stockley 10/18/2013, 3:54 PM

## 2013-10-19 DIAGNOSIS — D62 Acute posthemorrhagic anemia: Secondary | ICD-10-CM | POA: Diagnosis not present

## 2013-10-19 LAB — CBC
HEMATOCRIT: 27.7 % — AB (ref 36.0–46.0)
Hemoglobin: 9.5 g/dL — ABNORMAL LOW (ref 12.0–15.0)
MCH: 32.2 pg (ref 26.0–34.0)
MCHC: 34.3 g/dL (ref 30.0–36.0)
MCV: 93.9 fL (ref 78.0–100.0)
Platelets: 162 10*3/uL (ref 150–400)
RBC: 2.95 MIL/uL — AB (ref 3.87–5.11)
RDW: 13.3 % (ref 11.5–15.5)
WBC: 6.8 10*3/uL (ref 4.0–10.5)

## 2013-10-19 MED ORDER — PANTOPRAZOLE SODIUM 40 MG PO TBEC: 40.0000 mg | DELAYED_RELEASE_TABLET | Freq: Every day | ORAL | Status: AC

## 2013-10-19 MED ORDER — METOCLOPRAMIDE HCL 5 MG PO TABS: 5.0000 mg | ORAL_TABLET | Freq: Three times a day (TID) | ORAL | Status: AC | PRN

## 2013-10-19 MED ORDER — BISACODYL 10 MG RE SUPP: 10.0000 mg | Freq: Every day | RECTAL | Status: AC | PRN

## 2013-10-19 MED ORDER — DSS 100 MG PO CAPS: 100.0000 mg | ORAL_CAPSULE | Freq: Two times a day (BID) | ORAL | Status: AC

## 2013-10-19 MED ORDER — ACETAMINOPHEN 325 MG PO TABS: 650.0000 mg | ORAL_TABLET | Freq: Four times a day (QID) | ORAL | Status: AC | PRN

## 2013-10-19 MED ORDER — METHOCARBAMOL 500 MG PO TABS: 500.0000 mg | ORAL_TABLET | Freq: Four times a day (QID) | ORAL | Status: AC | PRN

## 2013-10-19 MED ORDER — TRAMADOL HCL 50 MG PO TABS: 50.0000 mg | ORAL_TABLET | Freq: Four times a day (QID) | ORAL | Status: AC | PRN

## 2013-10-19 MED ORDER — RIVAROXABAN 10 MG PO TABS: 10.0000 mg | ORAL_TABLET | Freq: Every day | ORAL | Status: AC

## 2013-10-19 MED ORDER — POLYETHYLENE GLYCOL 3350 17 G PO PACK: 17.0000 g | PACK | Freq: Every day | ORAL | Status: AC | PRN

## 2013-10-19 MED ORDER — ONDANSETRON HCL 4 MG PO TABS: 4.0000 mg | ORAL_TABLET | Freq: Four times a day (QID) | ORAL | Status: AC | PRN

## 2013-10-19 MED ORDER — OXYCODONE HCL 5 MG PO TABS: 5.0000 mg | ORAL_TABLET | ORAL | Status: AC | PRN

## 2013-10-19 NOTE — Progress Notes (Signed)
Clinical Social Work Department CLINICAL SOCIAL WORK PLACEMENT NOTE 10/19/2013  Patient:  [facility]  Account Number:  [id number] [facility] date:  10/16/2013  Clinical Social Worker:  Werner Lean, LCSW  Date/time:  10/17/2013 12:00 M  Clinical Social Work is seeking post-discharge placement for this patient at the following level of care:   SKILLED NURSING   (*CSW will update this form in Epic as items are completed)     Patient/family provided with [place] Department of Clinical Social Work's list of facilities offering this level of care within the geographic area requested by the patient (or if unable, by the patient's family).  10/17/2013  Patient/family informed of their freedom to choose among providers that offer the needed level of care, that participate in Medicare, Medicaid or managed care program needed by the patient, have an available bed and are willing to accept the patient.    Patient/family informed of [facility]' ownership interest in [facility], as well as of the fact that they are under no obligation to receive care at this facility.  PASARR submitted to EDS on 10/17/2013 PASARR number received from EDS on 10/17/2013  FL2 transmitted to all facilities in geographic area requested by pt/family on  10/17/2013 FL2 transmitted to all facilities within larger geographic area on   Patient informed that his/her managed care company has contracts with or will negotiate with  certain facilities, including the following:     Patient/family informed of bed offers received:  10/17/2013 Patient chooses bed at  Physician recommends and patient chooses bed at    Patient to be transferred to  on  10/19/2013 Patient to be transferred to facility by [facility]  The following physician request were entered in Epic:   Additional Comments:  Werner Lean LCSW [phone]

## 2013-10-19 NOTE — Progress Notes (Signed)
   Subjective: 3 Days Post-Op Procedure(s) (LRB): RIGHT TOTAL HIP ARTHROPLASTY ANTERIOR APPROACH (Right) Patient reports pain as mild.   Patient seen in rounds with Dr. Wynelle Link. Patient is well, and has had no acute complaints or problems Patient is ready to go to the SNF - [facility] in [place], [place].  Objective: Vital signs in last 24 hours: Temp:  [98.1 F (36.7 C)-99.2 F (37.3 C)] 98.1 F (36.7 C) (02/13 0605) Pulse Rate:  [90-119] 90 (02/13 0605) Resp:  [16-18] 16 (02/13 0605) BP: (107-140)/(69-93) 132/81 mmHg (02/13 0605) SpO2:  [91 %-99 %] 97 % (02/13 0605)  Intake/Output from previous day:  Intake/Output Summary (Last 24 hours) at 10/19/13 0810 Last data filed at 10/19/13 0600  Gross per 24 hour  Intake    720 ml  Output    650 ml  Net     70 ml    Intake/Output this shift:    Labs:  Recent Labs  10/17/13 0450 10/18/13 0504 10/19/13 0456  HGB 9.9* 10.4* 9.5*    Recent Labs  10/18/13 0504 10/19/13 0456  WBC 10.4 6.8  RBC 3.32* 2.95*  HCT 31.0* 27.7*  PLT 175 162    Recent Labs  10/17/13 0450 10/18/13 0504  NA 137 143  K 4.8 3.7  CL 103 107  CO2 27 27  BUN 23 15  CREATININE 1.11* 1.02  GLUCOSE 152* 109*  CALCIUM 8.2* 8.7   No results found for this basename: LABPT, INR,  in the last 72 hours  EXAM: General - Patient is Alert and Appropriate Extremity - Neurovascular intact Sensation intact distally Dorsiflexion/Plantar flexion intact Incision - clean, dry, no drainage Motor Function - intact, moving foot and toes well on exam.   Assessment/Plan: 3 Days Post-Op Procedure(s) (LRB): RIGHT TOTAL HIP ARTHROPLASTY ANTERIOR APPROACH (Right) Procedure(s) (LRB): RIGHT TOTAL HIP ARTHROPLASTY ANTERIOR APPROACH (Right) Past Medical History  Diagnosis Date  . Arthritis     oa hip  . Vitamin D deficiency   . Migraine   . Carotid artery aneurysm     lov note dr Kathlynn Grate 09-12-2013 medical clearance note on chart  . Atherosclerosis   .  Cancer 2008    brain  . Hypertension   . 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 Brain tumor     high grade oligogastrocytoma  . High cholesterol   . Acute renal failure several months ago due to dehydration  . Bursitis   . Leukopenia   . Osteopenia   . Seizures     last seizure 4 yrs ago   Principal Problem:   OA (osteoarthritis) of hip Active Problems:   Postoperative anemia due to acute blood loss  Estimated body mass index is 28.7 kg/(m^2) as calculated from the following:   Height as of this encounter: 5\' 3"  (1.6 m).   Weight as of this encounter: 73.483 kg (162 lb). Up with therapy Discharge to SNF Diet - Cardiac diet and Renal diet Follow up - in 2 weeks Activity - WBAT Disposition - Skilled nursing facility Condition Upon Discharge - Good D/C Meds - See DC Summary DVT Prophylaxis - Xarelto  Gerre Ranum 10/19/2013, 8:10 AM

## 2013-10-19 NOTE — Discharge Summary (Signed)
Physician Discharge Summary   Patient ID: Tracy Skinner MRN: [id number] DOB/AGE: October 28, 1945 68 y.o.  Admit date: 10/16/2013 Discharge date: 10-19-2013  Primary Diagnosis:  Osteoarthritis of the Right hip.   Admission Diagnoses:  Past Medical History  Diagnosis Date  . Arthritis     oa hip  . Vitamin D deficiency   . Migraine   . Carotid artery aneurysm     lov note dr Kathlynn Grate 09-12-2013 medical clearance note on chart  . Atherosclerosis   . Cancer 2008    brain  . Hypertension   . 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 Brain tumor     high grade oligogastrocytoma  . High cholesterol   . Acute renal failure several months ago due to dehydration  . Bursitis   . Leukopenia   . Osteopenia   . Seizures     last seizure 4 yrs ago   Discharge Diagnoses:   Principal Problem:   OA (osteoarthritis) of hip Active Problems:   Postoperative anemia due to acute blood loss  Estimated body mass index is 28.7 kg/(m^2) as calculated from the following:   Height as of this encounter: $RemoveBeforeD'5\' 3"'resSWijVdAxSlb$  (1.6 m).   Weight as of this encounter: 73.483 kg (162 lb).  Procedure(s) (LRB): RIGHT TOTAL HIP ARTHROPLASTY ANTERIOR APPROACH (Right)   Consults: None  HPI: Tracy Skinner is a 68 y.o. female who has advanced end-  stage arthritis of his Right hip with progressively worsening pain and  dysfunction.The patient has failed nonoperative management and presents for  total hip arthroplasty.   Laboratory Data: Admission on 10/16/2013  Component Date Value Ref Range Status  . ABO/RH(D) 10/16/2013 A POS   Final  . Antibody Screen 10/16/2013 NEG   Final  . Sample Expiration 10/16/2013 10/19/2013   Final  . ABO/RH(D) 10/16/2013 A POS   Final  . WBC 10/17/2013 7.6  4.0 - 10.5 K/uL Final  . RBC 10/17/2013 3.23* 3.87 - 5.11 MIL/uL Final  . Hemoglobin 10/17/2013 9.9* 12.0 - 15.0 g/dL Final  . HCT 10/17/2013 30.1* 36.0 - 46.0 % Final  . MCV 10/17/2013 93.2  78.0 - 100.0 fL Final  . MCH  10/17/2013 30.7  26.0 - 34.0 pg Final  . MCHC 10/17/2013 32.9  30.0 - 36.0 g/dL Final  . RDW 10/17/2013 12.7  11.5 - 15.5 % Final  . Platelets 10/17/2013 175  150 - 400 K/uL Final  . Sodium 10/17/2013 137  137 - 147 mEq/L Final  . Potassium 10/17/2013 4.8  3.7 - 5.3 mEq/L Final  . Chloride 10/17/2013 103  96 - 112 mEq/L Final  . CO2 10/17/2013 27  19 - 32 mEq/L Final  . Glucose, Bld 10/17/2013 152* 70 - 99 mg/dL Final  . BUN 10/17/2013 23  6 - 23 mg/dL Final  . Creatinine, Ser 10/17/2013 1.11* 0.50 - 1.10 mg/dL Final  . Calcium 10/17/2013 8.2* 8.4 - 10.5 mg/dL Final  . GFR calc non Af Amer 10/17/2013 51* >90 mL/min Final  . GFR calc Af Amer 10/17/2013 59* >90 mL/min Final   Comment: (NOTE)                          The eGFR has been calculated using the CKD EPI equation.                          This calculation has not been validated  in all clinical situations.                          eGFR's persistently <90 mL/min signify possible Chronic Kidney                          Disease.  . WBC 10/18/2013 10.4  4.0 - 10.5 K/uL Final  . RBC 10/18/2013 3.32* 3.87 - 5.11 MIL/uL Final  . Hemoglobin 10/18/2013 10.4* 12.0 - 15.0 g/dL Final  . HCT 10/18/2013 31.0* 36.0 - 46.0 % Final  . MCV 10/18/2013 93.4  78.0 - 100.0 fL Final  . MCH 10/18/2013 31.3  26.0 - 34.0 pg Final  . MCHC 10/18/2013 33.5  30.0 - 36.0 g/dL Final  . RDW 10/18/2013 13.1  11.5 - 15.5 % Final  . Platelets 10/18/2013 175  150 - 400 K/uL Final  . Sodium 10/18/2013 143  137 - 147 mEq/L Final  . Potassium 10/18/2013 3.7  3.7 - 5.3 mEq/L Final   Comment: REPEATED TO VERIFY                          DELTA CHECK NOTED  . Chloride 10/18/2013 107  96 - 112 mEq/L Final  . CO2 10/18/2013 27  19 - 32 mEq/L Final  . Glucose, Bld 10/18/2013 109* 70 - 99 mg/dL Final  . BUN 10/18/2013 15  6 - 23 mg/dL Final  . Creatinine, Ser 10/18/2013 1.02  0.50 - 1.10 mg/dL Final  . Calcium 10/18/2013 8.7  8.4 - 10.5 mg/dL Final  . GFR calc non Af Amer  10/18/2013 56* >90 mL/min Final  . GFR calc Af Amer 10/18/2013 65* >90 mL/min Final   Comment: (NOTE)                          The eGFR has been calculated using the CKD EPI equation.                          This calculation has not been validated in all clinical situations.                          eGFR's persistently <90 mL/min signify possible Chronic Kidney                          Disease.  . WBC 10/19/2013 6.8  4.0 - 10.5 K/uL Final  . RBC 10/19/2013 2.95* 3.87 - 5.11 MIL/uL Final  . Hemoglobin 10/19/2013 9.5* 12.0 - 15.0 g/dL Final  . HCT 10/19/2013 27.7* 36.0 - 46.0 % Final  . MCV 10/19/2013 93.9  78.0 - 100.0 fL Final  . MCH 10/19/2013 32.2  26.0 - 34.0 pg Final  . MCHC 10/19/2013 34.3  30.0 - 36.0 g/dL Final  . RDW 10/19/2013 13.3  11.5 - 15.5 % Final  . Platelets 10/19/2013 162  150 - 400 K/uL Final  Hospital Outpatient Visit on 09/27/2013  Component Date Value Ref Range Status  . MRSA, PCR 09/27/2013 NEGATIVE  NEGATIVE Final  . Staphylococcus aureus 09/27/2013 NEGATIVE  NEGATIVE Final   Comment:  The Xpert SA Assay (FDA                          approved for NASAL specimens                          in patients over 31 years of age),                          is one component of                          a comprehensive surveillance                          program.  Test performance has                          been validated by [employer] for patients greater                          than or equal to 25 year old.                          It is not intended                          to diagnose infection nor to                          guide or monitor treatment.  Marland Kitchen aPTT 09/27/2013 27  24 - 37 seconds Final  . WBC 09/27/2013 4.2  4.0 - 10.5 K/uL Final  . RBC 09/27/2013 4.07  3.87 - 5.11 MIL/uL Final  . Hemoglobin 09/27/2013 12.7  12.0 - 15.0 g/dL Final  . HCT 09/27/2013 37.9  36.0 - 46.0 % Final  . MCV 09/27/2013  93.1  78.0 - 100.0 fL Final  . MCH 09/27/2013 31.2  26.0 - 34.0 pg Final  . MCHC 09/27/2013 33.5  30.0 - 36.0 g/dL Final  . RDW 09/27/2013 13.0  11.5 - 15.5 % Final  . Platelets 09/27/2013 217  150 - 400 K/uL Final  . Sodium 09/27/2013 142  137 - 147 mEq/L Final  . Potassium 09/27/2013 3.9  3.7 - 5.3 mEq/L Final  . Chloride 09/27/2013 103  96 - 112 mEq/L Final  . CO2 09/27/2013 28  19 - 32 mEq/L Final  . Glucose, Bld 09/27/2013 95  70 - 99 mg/dL Final  . BUN 09/27/2013 29* 6 - 23 mg/dL Final  . Creatinine, Ser 09/27/2013 1.30* 0.50 - 1.10 mg/dL Final  . Calcium 09/27/2013 9.1  8.4 - 10.5 mg/dL Final  . Total Protein 09/27/2013 6.8  6.0 - 8.3 g/dL Final  . Albumin 09/27/2013 3.7  3.5 - 5.2 g/dL Final  . AST 09/27/2013 25  0 - 37 U/L Final  . ALT 09/27/2013 24  0 - 35 U/L Final  . Alkaline Phosphatase 09/27/2013 73  39 - 117 U/L Final  . Total Bilirubin 09/27/2013 0.4  0.3 - 1.2 mg/dL Final  .  GFR calc non Af Amer 09/27/2013 42* >90 mL/min Final  . GFR calc Af Amer 09/27/2013 49* >90 mL/min Final   Comment: (NOTE)                          The eGFR has been calculated using the CKD EPI equation.                          This calculation has not been validated in all clinical situations.                          eGFR's persistently <90 mL/min signify possible Chronic Kidney                          Disease.  Marland Kitchen Prothrombin Time 09/27/2013 12.6  11.6 - 15.2 seconds Final  . INR 09/27/2013 0.96  0.00 - 1.49 Final  . Color, Urine 09/27/2013 YELLOW  YELLOW Final  . APPearance 09/27/2013 CLEAR  CLEAR Final  . Specific Gravity, Urine 09/27/2013 1.026  1.005 - 1.030 Final  . pH 09/27/2013 5.5  5.0 - 8.0 Final  . Glucose, UA 09/27/2013 NEGATIVE  NEGATIVE mg/dL Final  . Hgb urine dipstick 09/27/2013 NEGATIVE  NEGATIVE Final  . Bilirubin Urine 09/27/2013 NEGATIVE  NEGATIVE Final  . Ketones, ur 09/27/2013 NEGATIVE  NEGATIVE mg/dL Final  . Protein, ur 09/27/2013 NEGATIVE  NEGATIVE mg/dL Final  .  Urobilinogen, UA 09/27/2013 0.2  0.0 - 1.0 mg/dL Final  . Nitrite 09/27/2013 NEGATIVE  NEGATIVE Final  . Leukocytes, UA 09/27/2013 SMALL* NEGATIVE Final  . Squamous Epithelial / LPF 09/27/2013 RARE  RARE Final  . WBC, UA 09/27/2013 3-6  <3 WBC/hpf Final  . RBC / HPF 09/27/2013 0-2  <3 RBC/hpf Final  . Bacteria, UA 09/27/2013 RARE  RARE Final     X-Rays:Dg Chest 2 View  09/27/2013   CLINICAL DATA:  Preop for right hip surgery  EXAM: CHEST  2 VIEW  COMPARISON:  None.  FINDINGS: Cardiomediastinal silhouette is unremarkable. No acute infiltrate or pleural effusion. No pulmonary edema. Bony thorax is unremarkable.  IMPRESSION: No active cardiopulmonary disease.   Electronically Signed   By: Lahoma Crocker M.D.   On: 09/27/2013 15:07   Dg Hip Complete Right  09/27/2013   CLINICAL DATA:  Preop  EXAM: RIGHT HIP - COMPLETE 2+ VIEW  COMPARISON:  None.  FINDINGS: Severe degenerative change of the right hip joint with bone-on-bone and juxta-articular cystic change. Moderate to severe degenerative change of the left hip joint. Osteopenia. No acute fracture or dislocation.  IMPRESSION: Degenerative change.  No acute bony injury.   Electronically Signed   By: Maryclare Bean M.D.   On: 09/27/2013 16:01   Dg Pelvis Portable  10/16/2013   CLINICAL DATA:  Postop right hip.  EXAM: PORTABLE PELVIS 1-2 VIEWS  COMPARISON:  None.  FINDINGS: Patient is status post total right hip arthroplasty. Hardware appears intact but evidence of loosening or failure. Surgical drains are identified. Native osseous structures demonstrate degenerative changes involving the left hip.  IMPRESSION: Patient is status post total right hip arthroplasty as described above.   Electronically Signed   By: Margaree Mackintosh M.D.   On: 10/16/2013 17:01   Dg C-arm 1-60 Min-no Report  10/16/2013   CLINICAL DATA: INTRAOP ANT HIP   C-ARM 1-60 MINUTES  Fluoroscopy was utilized  by the requesting physician.  No radiographic  interpretation.     EKG: Orders placed  during the hospital encounter of 09/27/13  . EKG 12-LEAD  . EKG 12-LEAD     Hospital Course: Patient was admitted to [facility] and taken to the OR and underwent the above state procedure without complications.  Patient tolerated the procedure well and was later transferred to the recovery room and then to the orthopaedic floor for postoperative care.  They were given PO and IV analgesics for pain control following their surgery.  They were given 24 hours of postoperative antibiotics of  Anti-infectives   Start     Dose/Rate Route Frequency Ordered Stop   10/16/13 2000  ceFAZolin (ANCEF) IVPB 2 g/50 mL premix     2 g 100 mL/hr over 30 Minutes Intravenous Every 6 hours 10/16/13 1735 10/17/13 0326   10/16/13 1005  ceFAZolin (ANCEF) IVPB 2 g/50 mL premix     2 g 100 mL/hr over 30 Minutes Intravenous On call to O.R. 10/16/13 1005 10/16/13 1337     and started on DVT prophylaxis in the form of Xarelto.   PT and OT were ordered for total hip protocol.  The patient was allowed to be WBAT with therapy. Discharge planning was consulted to help with postop disposition and equipment needs.  Patient had a tough night on the evening of surgery.  They started to get up OOB with therapy on day one.  Hemovac drain was pulled without difficulty.  Patient seen in rounds with Dr. Wynelle Link on day two. Had complaints of reflux. Added protonix. Continued to work with therapy into day two.  Dressing was changed on day two and the incision was healing well.  By day three, the patient had progressed with therapy and meeting their goals.  Incision was healing well.  Patient was seen in rounds and was ready to go to the SNF.  Discharge to SNF  Diet - Cardiac diet and Renal diet  Follow up - in 2 weeks  Activity - WBAT  Disposition - Skilled nursing facility  Condition Upon Discharge - Good  D/C Meds - See DC Summary  DVT Prophylaxis - Xarelto   Discharge Orders   Future Orders Complete By Expires   Call  MD / Call 911  As directed    Comments:     If you experience chest pain or shortness of breath, CALL 911 and be transported to the hospital emergency room.  If you develope a fever above 101 F, pus (white drainage) or increased drainage or redness at the wound, or calf pain, call your surgeon's office.   Change dressing  As directed    Comments:     You may change your dressing dressing daily with sterile 4 x 4 inch gauze dressing and paper tape.  Do not submerge the incision under water.   Constipation Prevention  As directed    Comments:     Drink plenty of fluids.  Prune juice may be helpful.  You may use a stool softener, such as Colace (over the counter) 100 mg twice a day.  Use MiraLax (over the counter) for constipation as needed.   Diet - low sodium heart healthy  As directed    Discharge instructions  As directed    Comments:     Pick up stool softner and laxative for home. Do not submerge incision under water. May shower. Continue to use ice for pain and swelling from surgery.  Total Hip Protocol.  Take Xarelto for two and a half more weeks, then discontinue Xarelto. Once the patient has completed the blood thinner regimen, then take a Baby 81 mg Aspirin daily for three more weeks.  When discharged from the skilled rehab facility, please have the facility set up the patient's Home Health Physical Therapy prior to being released.  Also provide the patient with their medications at time of release from the facility to include their pain medication, the muscle relaxants, and their blood thinner medication.  If the patient is still at the rehab facility at time of follow up appointment, please also assist the patient in arranging follow up appointment in our office and any transportation needs.   Do not sit on low chairs, stoools or toilet seats, as it may be difficult to get up from low surfaces  As directed    Driving restrictions  As directed    Comments:     No driving until  released by the physician.   Increase activity slowly as tolerated  As directed    Lifting restrictions  As directed    Comments:     No lifting until released by the physician.   Patient may shower  As directed    Comments:     You may shower without a dressing once there is no drainage.  Do not wash over the wound.  If drainage remains, do not shower until drainage stops.   TED hose  As directed    Comments:     Use stockings (TED hose) for 3 weeks on both leg(s).  You may remove them at night for sleeping.   Weight bearing as tolerated  As directed    Questions:     Laterality:     Extremity:         Medication List    STOP taking these medications       folic acid 400 MCG tablet  Commonly known as:  FOLVITE     ibuprofen 200 MG tablet  Commonly known as:  ADVIL,MOTRIN     Krill Oil 300 MG Caps     OSTEO BI-FLEX ADV JOINT SHIELD PO     OVER THE COUNTER MEDICATION     Vitamin B-12 5000 MCG Subl     Vitamin D3 2000 UNITS capsule      TAKE these medications       acetaminophen 325 MG tablet  Commonly known as:  TYLENOL  Take 2 tablets (650 mg total) by mouth every 6 (six) hours as needed for mild pain (or Fever >/= 101).     bisacodyl 10 MG suppository  Commonly known as:  DULCOLAX  Place 1 suppository (10 mg total) rectally daily as needed for moderate constipation.     buPROPion 150 MG 24 hr tablet  Commonly known as:  WELLBUTRIN XL  Take 450 mg by mouth every morning.     DSS 100 MG Caps  Take 100 mg by mouth 2 (two) times daily.     levETIRAcetam 500 MG 24 hr tablet  Commonly known as:  KEPPRA XR  Take 500 mg by mouth 3 (three) times daily.     losartan 50 MG tablet  Commonly known as:  COZAAR  Take 50 mg by mouth at bedtime.     Magnesium 250 MG Tabs  Take 1 tablet by mouth daily.     methocarbamol 500 MG tablet  Commonly known as:  ROBAXIN  Take 1 tablet (500 mg  total) by mouth every 6 (six) hours as needed for muscle spasms.      metoCLOPramide 5 MG tablet  Commonly known as:  REGLAN  Take 1-2 tablets (5-10 mg total) by mouth every 8 (eight) hours as needed for nausea (if ondansetron (ZOFRAN) ineffective.).     ondansetron 4 MG tablet  Commonly known as:  ZOFRAN  Take 1 tablet (4 mg total) by mouth every 6 (six) hours as needed for nausea.     oxyCODONE 5 MG immediate release tablet  Commonly known as:  Oxy IR/ROXICODONE  Take 1-2 tablets (5-10 mg total) by mouth every 3 (three) hours as needed for moderate pain or severe pain.     pantoprazole 40 MG tablet  Commonly known as:  PROTONIX  Take 1 tablet (40 mg total) by mouth daily.     polyethylene glycol packet  Commonly known as:  MIRALAX / GLYCOLAX  Take 17 g by mouth daily as needed for mild constipation.     rivaroxaban 10 MG Tabs tablet  Commonly known as:  XARELTO  - Take 1 tablet (10 mg total) by mouth daily with breakfast. Take Xarelto for two and a half more weeks, then discontinue Xarelto.  - Once the patient has completed the blood thinner regimen, then take a Baby 81 mg Aspirin daily for three more weeks.     simvastatin 10 MG tablet  Commonly known as:  ZOCOR  Take 10 mg by mouth every evening.     traMADol 50 MG tablet  Commonly known as:  ULTRAM  Take 1-2 tablets (50-100 mg total) by mouth every 6 (six) hours as needed (mild to moderate pain).           Follow-up Information   Follow up with [name], MD. Schedule an appointment as soon as possible for a visit in 2 weeks.   Specialty:  Orthopedic Surgery   Contact information:   [address] [place] [address] [phone]       Signed: Mickel Crow 10/19/2013, 8:27 AM

## 2013-10-19 NOTE — Progress Notes (Signed)
Report called to [facility] at [facility]. All questions answered.   Patient transferred via [place].

## 2013-10-19 NOTE — Progress Notes (Signed)
CSW assisting with d/c planning. [facility] contacted. [employer] indicated to [place] [facility], it would be Monday or Tuesday before authorization would be provided. SNF, CSW, and pt's spouse have left messages with case manager at [facility] requesting assistance with authorization process for D/C today. Spouse is also contacting customer service at [facility] for assistance. No calls have been returned at this point. CSW will continue assisting with d/c planning.  Werner Lean LCSW [phone]

## 2013-10-19 NOTE — Progress Notes (Signed)
CSW spoke with [employer] rep. Pt has been approved for SNF placement today. Pt / spouse are aware. [facility] contacted and is ready for pt to be trans. Spouse is aware insurance will not cover transport to [facility]. Spouse has made payment arrangements with [facility] for transport.  Werner Lean LCSW [phone]

## 2013-10-19 NOTE — Progress Notes (Signed)
Physical Therapy Treatment Patient Details Name: Tracy Skinner MRN: [id number] DOB: 1946/01/01 Today's Date: 10/19/2013 Time: 1110-1140 PT Time Calculation (min): 30 min  PT Assessment / Plan / Recommendation  History of Present Illness Pt was admitted for R DA THA.  She has a h/o brain tumor and seizures   PT Comments   Pt continues cooperative and progressing slowly.  Pt requiring constant step-by-step cues +2 assist for safety 2* Bil LE buckling.   Follow Up Recommendations  SNF     Does the patient have the potential to tolerate intense rehabilitation     Barriers to Discharge        Equipment Recommendations  None recommended by PT    Recommendations for Other Services OT consult  Frequency 7X/week   Progress towards PT Goals Progress towards PT goals: Progressing toward goals  Plan Current plan remains appropriate    Precautions / Restrictions Precautions Precautions: Fall Precaution Comments: Bil knees buckle; pt sits prematurely Restrictions Weight Bearing Restrictions: No Other Position/Activity Restrictions: WBAT   Pertinent Vitals/Pain 7/10; premed Tylenol, ice pack provided    Mobility  Bed Mobility Overal bed mobility: Needs Assistance;+2 for physical assistance Bed Mobility: Supine to Sit Supine to sit: +2 for physical assistance;Mod assist General bed mobility comments: cues for sequencing and use of L LE to self assist.  Physical assist for R LE and to bring pt to EOB using pad Transfers Overall transfer level: Needs assistance Equipment used: Rolling walker (2 wheeled) Transfers: Sit to/from Stand Sit to Stand: +2 physical assistance;Min assist;Mod assist General transfer comment: cues for hand placement, keeping trunk erect, sequence with steps and safety Ambulation/Gait Ambulation/Gait assistance: +2 physical assistance;Mod assist Ambulation Distance (Feet): 13 Feet (and 3) Assistive device: Rolling walker (2 wheeled) Gait Pattern/deviations:  Step-to pattern;Decreased step length - right;Decreased step length - left;Shuffle;Antalgic;Trunk flexed Gait velocity: decr General Gait Details: Constant step by step cues for sequence, posture, UE WB, foot placement, stride length, position from RW.  Pt with fluctuating ability to follow cues and several episodes LE buckling but improvement over last session    Exercises Total Joint Exercises Ankle Circles/Pumps: AROM;Both;15 reps;Supine Quad Sets: AROM;Both;10 reps;Supine Heel Slides: AAROM;Right;Supine;20 reps Hip ABduction/ADduction: AAROM;Right;Supine;20 reps   PT Diagnosis:    PT Problem List:   PT Treatment Interventions:     PT Goals (current goals can now be found in the care plan section) Acute Rehab PT Goals Patient Stated Goal: Rehab and home to resume previous lifestyle with decreased pain PT Goal Formulation: With patient Time For Goal Achievement: 10/24/13 Potential to Achieve Goals: Good  Visit Information  Last PT Received On: 10/19/13 Assistance Needed: +2 History of Present Illness: Pt was admitted for R DA THA.  She has a h/o brain tumor and seizures    Subjective Data  Subjective: It hurts Patient Stated Goal: Rehab and home to resume previous lifestyle with decreased pain   Cognition  Cognition Arousal/Alertness: Awake/alert Behavior During Therapy: WFL for tasks assessed/performed Overall Cognitive Status: Within Functional Limits for tasks assessed    Balance     End of Session PT - End of Session Equipment Utilized During Treatment: Gait belt Activity Tolerance: Patient tolerated treatment well Patient left: with call bell/phone within reach;with nursing/sitter in room;in chair Nurse Communication: Mobility status   GP     Yandel Zeiner 10/19/2013, 1:24 PM

## 2014-03-06 DEATH — deceased

## 2014-05-22 IMAGING — CR DG HIP COMPLETE 2+V*R*
3 series · 3 of 3 positions shown · non-contrast
Comparison: None.

CLINICAL DATA: Preop

EXAM:
RIGHT HIP - COMPLETE 2+ VIEW

[t pelvis a.p.]
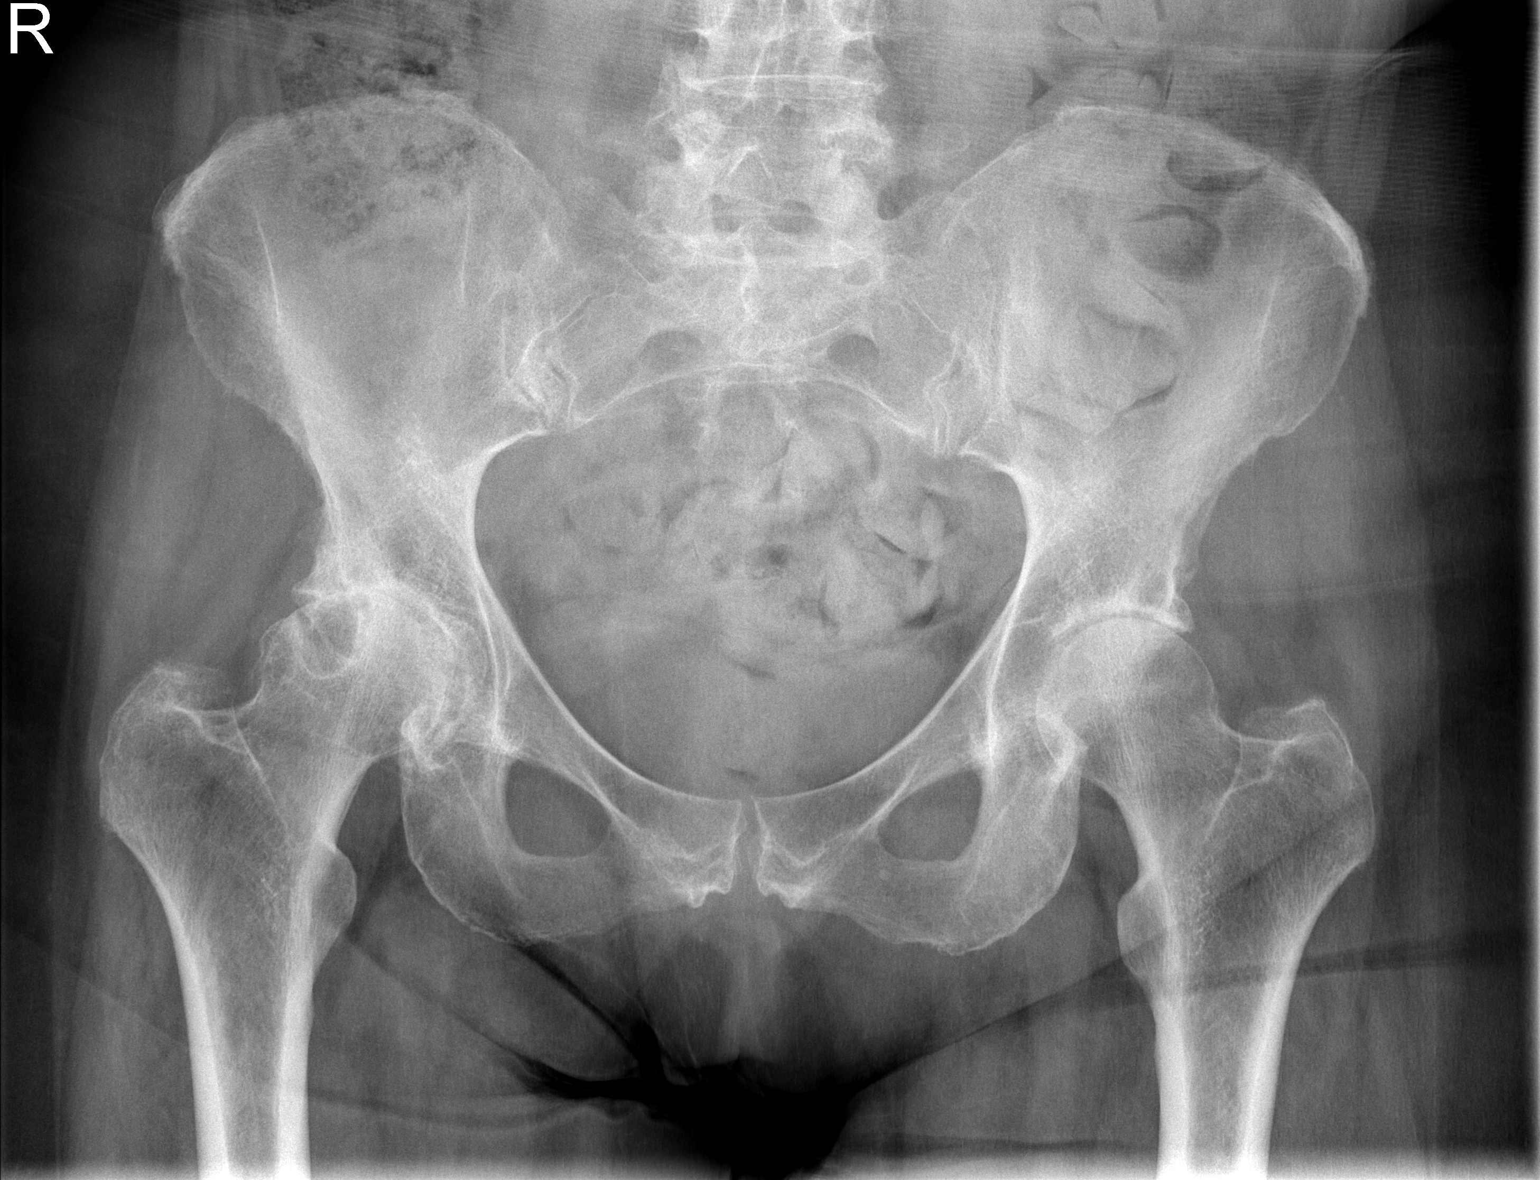

[t hip ap right]
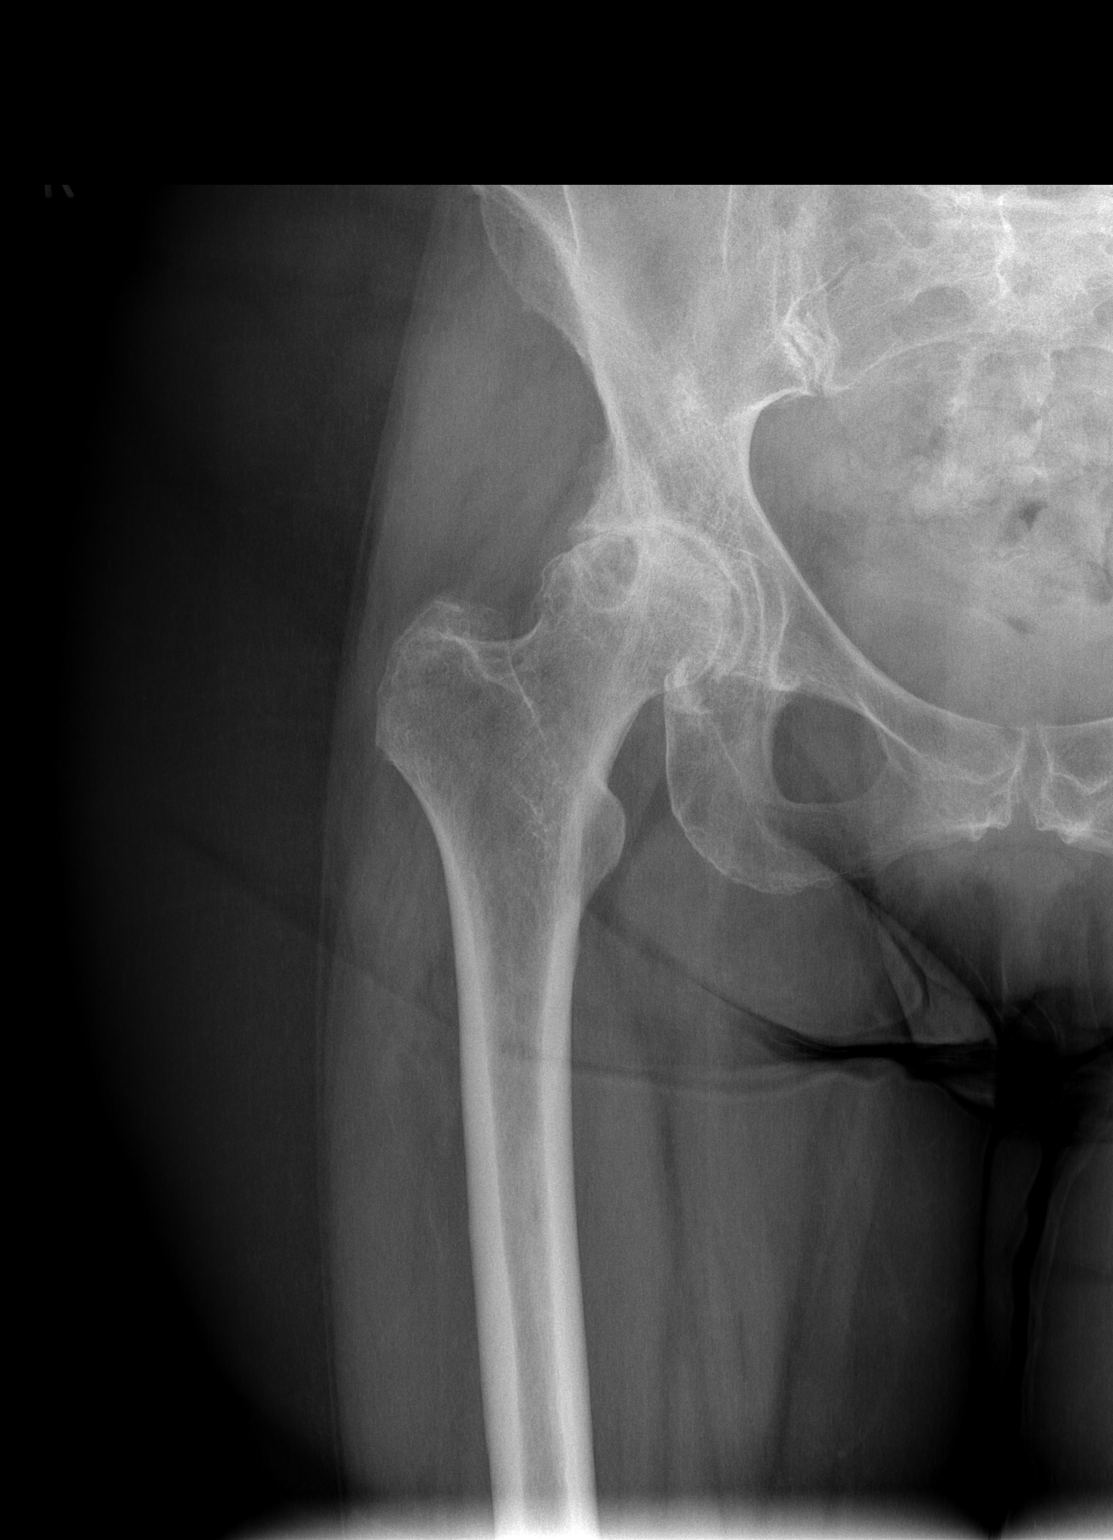

[t hip frog leg right]
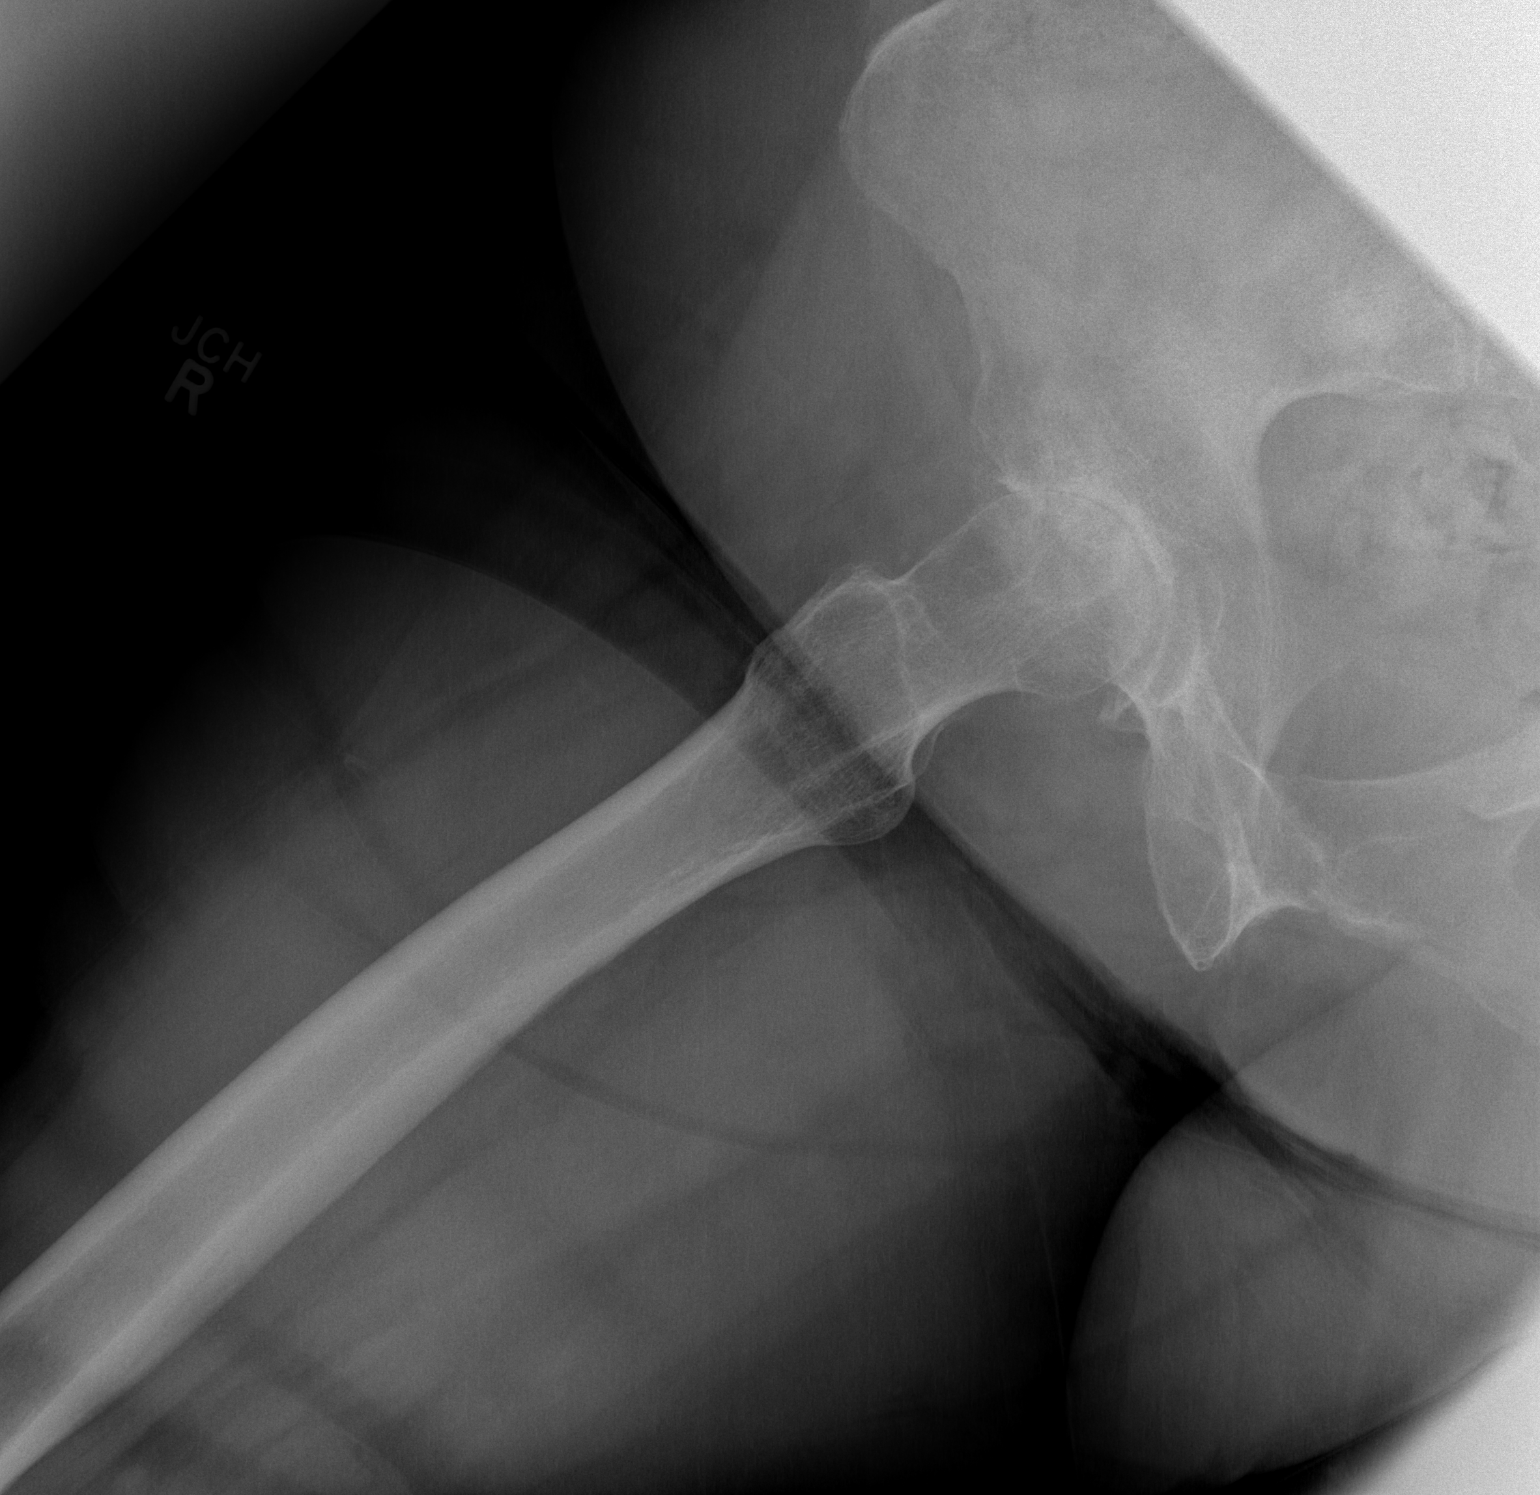

[3 of 3 positions shown; findings below may reference images not displayed]

FINDINGS: Severe degenerative change of the right hip joint with bone-on-bone
and juxta-articular cystic change. Moderate to severe degenerative
change of the left hip joint. Osteopenia. No acute fracture or
dislocation.
IMPRESSION: Degenerative change.  No acute bony injury.

## 2014-06-10 IMAGING — CR DG PORTABLE PELVIS
1 series · 1 of 1 positions shown · non-contrast
Comparison: None.

CLINICAL DATA: Postop right hip.

EXAM:
PORTABLE PELVIS 1-2 VIEWS

[AP]
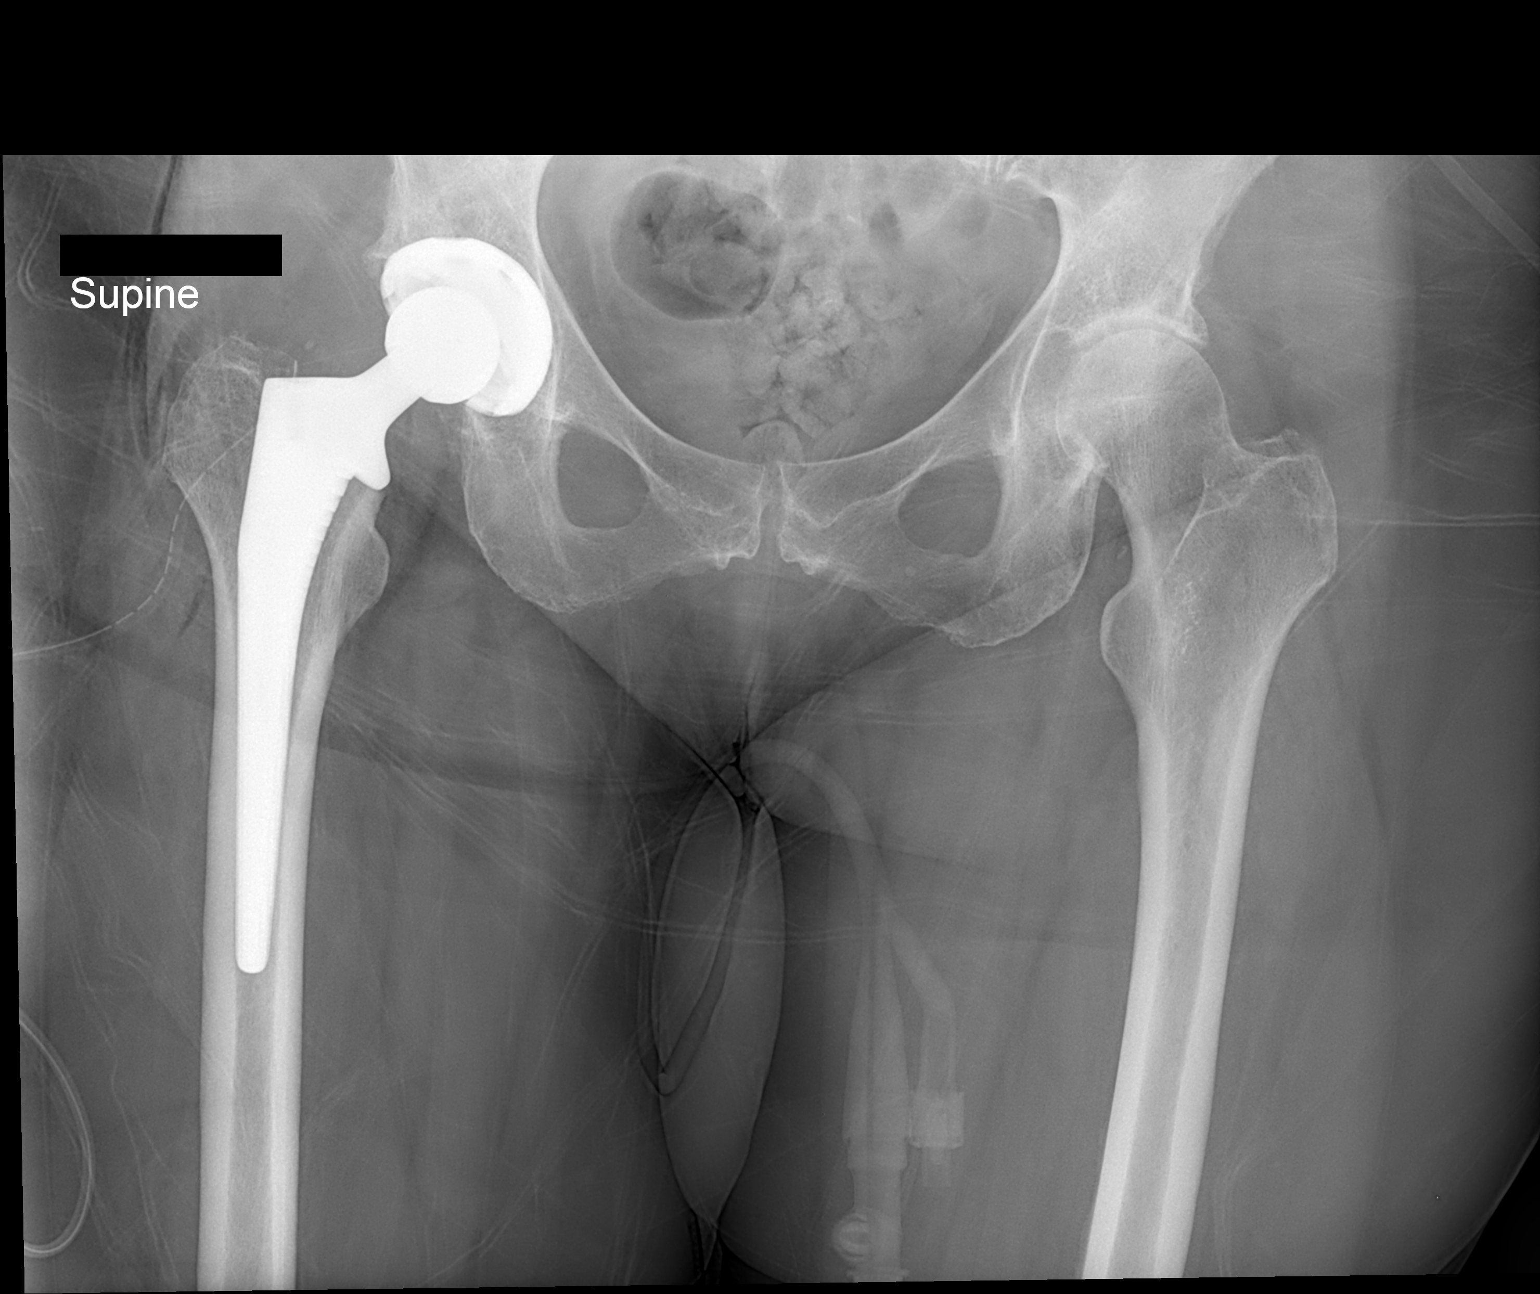

[1 of 1 positions shown; findings below may reference images not displayed]

FINDINGS: Patient is status post total right hip arthroplasty. Hardware
appears intact but evidence of loosening or failure. Surgical drains
are identified. Native osseous structures demonstrate degenerative
changes involving the left hip.
IMPRESSION: Patient is status post total right hip arthroplasty as described
above.
